# Patient Record
Sex: Male | Born: 1940 | Race: White | Hispanic: No | Marital: Married | Smoking: Former smoker
Health system: Southern US, Community
[De-identification: ages and names within clinical notes are randomized; demographics above are authoritative.]

## PROBLEM LIST (undated history)

## (undated) DIAGNOSIS — IMO0001 Reserved for inherently not codable concepts without codable children: Secondary | ICD-10-CM

## (undated) DIAGNOSIS — I350 Nonrheumatic aortic (valve) stenosis: Secondary | ICD-10-CM

## (undated) DIAGNOSIS — M1990 Unspecified osteoarthritis, unspecified site: Secondary | ICD-10-CM

## (undated) DIAGNOSIS — M109 Gout, unspecified: Secondary | ICD-10-CM

## (undated) DIAGNOSIS — I10 Essential (primary) hypertension: Secondary | ICD-10-CM

## (undated) DIAGNOSIS — I2510 Atherosclerotic heart disease of native coronary artery without angina pectoris: Secondary | ICD-10-CM

## (undated) DIAGNOSIS — E785 Hyperlipidemia, unspecified: Secondary | ICD-10-CM

## (undated) DIAGNOSIS — N400 Enlarged prostate without lower urinary tract symptoms: Secondary | ICD-10-CM

---

## 2006-04-15 ENCOUNTER — Ambulatory Visit: Payer: Self-pay | Admitting: Gastroenterology

## 2011-09-28 ENCOUNTER — Ambulatory Visit: Payer: Self-pay | Admitting: Gastroenterology

## 2014-02-19 DIAGNOSIS — I350 Nonrheumatic aortic (valve) stenosis: Secondary | ICD-10-CM | POA: Diagnosis not present

## 2014-02-19 DIAGNOSIS — I10 Essential (primary) hypertension: Secondary | ICD-10-CM | POA: Diagnosis not present

## 2014-02-19 DIAGNOSIS — E785 Hyperlipidemia, unspecified: Secondary | ICD-10-CM | POA: Diagnosis not present

## 2014-02-19 DIAGNOSIS — Z23 Encounter for immunization: Secondary | ICD-10-CM | POA: Diagnosis not present

## 2014-02-19 DIAGNOSIS — Z0000 Encounter for general adult medical examination without abnormal findings: Secondary | ICD-10-CM | POA: Diagnosis not present

## 2014-02-19 DIAGNOSIS — M109 Gout, unspecified: Secondary | ICD-10-CM | POA: Diagnosis not present

## 2014-02-26 DIAGNOSIS — I350 Nonrheumatic aortic (valve) stenosis: Secondary | ICD-10-CM | POA: Diagnosis not present

## 2014-02-26 DIAGNOSIS — I10 Essential (primary) hypertension: Secondary | ICD-10-CM | POA: Diagnosis not present

## 2014-02-26 DIAGNOSIS — R0602 Shortness of breath: Secondary | ICD-10-CM | POA: Diagnosis not present

## 2014-02-26 DIAGNOSIS — E782 Mixed hyperlipidemia: Secondary | ICD-10-CM | POA: Diagnosis not present

## 2014-03-05 DIAGNOSIS — I350 Nonrheumatic aortic (valve) stenosis: Secondary | ICD-10-CM | POA: Diagnosis not present

## 2014-03-05 DIAGNOSIS — E782 Mixed hyperlipidemia: Secondary | ICD-10-CM | POA: Diagnosis not present

## 2014-03-05 DIAGNOSIS — I10 Essential (primary) hypertension: Secondary | ICD-10-CM | POA: Diagnosis not present

## 2014-03-05 DIAGNOSIS — R0602 Shortness of breath: Secondary | ICD-10-CM | POA: Diagnosis not present

## 2014-05-08 DIAGNOSIS — E782 Mixed hyperlipidemia: Secondary | ICD-10-CM | POA: Diagnosis not present

## 2014-05-08 DIAGNOSIS — R0602 Shortness of breath: Secondary | ICD-10-CM | POA: Diagnosis not present

## 2014-05-08 DIAGNOSIS — I350 Nonrheumatic aortic (valve) stenosis: Secondary | ICD-10-CM | POA: Diagnosis not present

## 2014-05-08 DIAGNOSIS — I10 Essential (primary) hypertension: Secondary | ICD-10-CM | POA: Diagnosis not present

## 2014-05-22 DIAGNOSIS — D3131 Benign neoplasm of right choroid: Secondary | ICD-10-CM | POA: Diagnosis not present

## 2014-11-16 DIAGNOSIS — I10 Essential (primary) hypertension: Secondary | ICD-10-CM | POA: Diagnosis not present

## 2014-11-16 DIAGNOSIS — I350 Nonrheumatic aortic (valve) stenosis: Secondary | ICD-10-CM | POA: Diagnosis not present

## 2014-11-16 DIAGNOSIS — E782 Mixed hyperlipidemia: Secondary | ICD-10-CM | POA: Diagnosis not present

## 2014-11-30 DIAGNOSIS — I350 Nonrheumatic aortic (valve) stenosis: Secondary | ICD-10-CM | POA: Diagnosis not present

## 2014-12-03 DIAGNOSIS — I350 Nonrheumatic aortic (valve) stenosis: Secondary | ICD-10-CM | POA: Diagnosis not present

## 2014-12-03 DIAGNOSIS — I10 Essential (primary) hypertension: Secondary | ICD-10-CM | POA: Diagnosis not present

## 2015-02-01 DIAGNOSIS — I350 Nonrheumatic aortic (valve) stenosis: Secondary | ICD-10-CM | POA: Diagnosis not present

## 2015-02-01 DIAGNOSIS — I10 Essential (primary) hypertension: Secondary | ICD-10-CM | POA: Diagnosis not present

## 2015-02-01 DIAGNOSIS — E782 Mixed hyperlipidemia: Secondary | ICD-10-CM | POA: Diagnosis not present

## 2015-02-15 DIAGNOSIS — Z8739 Personal history of other diseases of the musculoskeletal system and connective tissue: Secondary | ICD-10-CM | POA: Diagnosis not present

## 2015-02-15 DIAGNOSIS — Z0000 Encounter for general adult medical examination without abnormal findings: Secondary | ICD-10-CM | POA: Diagnosis not present

## 2015-02-15 DIAGNOSIS — I10 Essential (primary) hypertension: Secondary | ICD-10-CM | POA: Diagnosis not present

## 2015-02-15 DIAGNOSIS — E782 Mixed hyperlipidemia: Secondary | ICD-10-CM | POA: Diagnosis not present

## 2015-02-22 DIAGNOSIS — I350 Nonrheumatic aortic (valve) stenosis: Secondary | ICD-10-CM | POA: Diagnosis not present

## 2015-02-22 DIAGNOSIS — R0602 Shortness of breath: Secondary | ICD-10-CM | POA: Diagnosis not present

## 2015-02-22 DIAGNOSIS — E782 Mixed hyperlipidemia: Secondary | ICD-10-CM | POA: Diagnosis not present

## 2015-02-22 DIAGNOSIS — I10 Essential (primary) hypertension: Secondary | ICD-10-CM | POA: Diagnosis not present

## 2015-02-27 ENCOUNTER — Encounter
Admission: RE | Disposition: A | Discharge status: Home / Self Care | Payer: Self-pay | Source: Ambulatory Visit | Attending: Internal Medicine

## 2015-02-27 ENCOUNTER — Ambulatory Visit
Admission: RE | Admit: 2015-02-27 | Discharge: 2015-02-27 | Disposition: A | Discharge status: Home / Self Care | Payer: Medicare Other | Source: Ambulatory Visit | Attending: Internal Medicine | Admitting: Internal Medicine

## 2015-02-27 ENCOUNTER — Encounter: Payer: Self-pay | Admitting: *Deleted

## 2015-02-27 DIAGNOSIS — E782 Mixed hyperlipidemia: Secondary | ICD-10-CM | POA: Diagnosis not present

## 2015-02-27 DIAGNOSIS — E790 Hyperuricemia without signs of inflammatory arthritis and tophaceous disease: Secondary | ICD-10-CM | POA: Insufficient documentation

## 2015-02-27 DIAGNOSIS — N400 Enlarged prostate without lower urinary tract symptoms: Secondary | ICD-10-CM | POA: Insufficient documentation

## 2015-02-27 DIAGNOSIS — Z79899 Other long term (current) drug therapy: Secondary | ICD-10-CM | POA: Diagnosis not present

## 2015-02-27 DIAGNOSIS — Z823 Family history of stroke: Secondary | ICD-10-CM | POA: Diagnosis not present

## 2015-02-27 DIAGNOSIS — I712 Thoracic aortic aneurysm, without rupture: Secondary | ICD-10-CM | POA: Insufficient documentation

## 2015-02-27 DIAGNOSIS — I719 Aortic aneurysm of unspecified site, without rupture: Secondary | ICD-10-CM | POA: Diagnosis not present

## 2015-02-27 DIAGNOSIS — I350 Nonrheumatic aortic (valve) stenosis: Secondary | ICD-10-CM | POA: Diagnosis not present

## 2015-02-27 DIAGNOSIS — Z809 Family history of malignant neoplasm, unspecified: Secondary | ICD-10-CM | POA: Diagnosis not present

## 2015-02-27 DIAGNOSIS — I10 Essential (primary) hypertension: Secondary | ICD-10-CM | POA: Insufficient documentation

## 2015-02-27 HISTORY — DX: Hyperlipidemia, unspecified: E78.5

## 2015-02-27 HISTORY — DX: Gout, unspecified: M10.9

## 2015-02-27 HISTORY — DX: Atherosclerotic heart disease of native coronary artery without angina pectoris: I25.10

## 2015-02-27 HISTORY — PX: CARDIAC CATHETERIZATION: SHX172

## 2015-02-27 HISTORY — DX: Reserved for inherently not codable concepts without codable children: IMO0001

## 2015-02-27 HISTORY — DX: Benign prostatic hyperplasia without lower urinary tract symptoms: N40.0

## 2015-02-27 HISTORY — DX: Nonrheumatic aortic (valve) stenosis: I35.0

## 2015-02-27 HISTORY — DX: Essential (primary) hypertension: I10

## 2015-02-27 SURGERY — RIGHT/LEFT HEART CATH AND CORONARY ANGIOGRAPHY
Anesthesia: Moderate Sedation

## 2015-02-27 MED ORDER — FENTANYL CITRATE (PF) 100 MCG/2ML IJ SOLN
INTRAMUSCULAR | Status: AC
  Filled 2015-02-27: qty 2

## 2015-02-27 MED ORDER — MIDAZOLAM HCL 2 MG/2ML IJ SOLN
INTRAMUSCULAR | Status: DC | PRN
  Administered 2015-02-27 (×2): 1 mg via INTRAVENOUS

## 2015-02-27 MED ORDER — FENTANYL CITRATE (PF) 100 MCG/2ML IJ SOLN
INTRAMUSCULAR | Status: DC | PRN
  Administered 2015-02-27 (×2): 25 ug via INTRAVENOUS

## 2015-02-27 MED ORDER — SODIUM CHLORIDE 0.9% FLUSH: 3.0000 mL | INTRAVENOUS | Status: DC | PRN

## 2015-02-27 MED ORDER — SODIUM CHLORIDE 0.9 % IV SOLN
INTRAVENOUS | Status: DC
Start: 2015-02-27 — End: 2015-02-27
  Administered 2015-02-27: 07:00:00 via INTRAVENOUS

## 2015-02-27 MED ORDER — MIDAZOLAM HCL 2 MG/2ML IJ SOLN
INTRAMUSCULAR | Status: AC
  Filled 2015-02-27: qty 2

## 2015-02-27 MED ORDER — HEPARIN (PORCINE) IN NACL 2-0.9 UNIT/ML-% IJ SOLN
INTRAMUSCULAR | Status: AC
  Filled 2015-02-27: qty 500

## 2015-02-27 MED ORDER — IOHEXOL 300 MG/ML  SOLN
INTRAMUSCULAR | Status: DC | PRN
  Administered 2015-02-27: 100 mL via INTRA_ARTERIAL

## 2015-02-27 SURGICAL SUPPLY — 16 items
CATH INFINITI 5 FR MPA2 (CATHETERS) ×3 IMPLANT
CATH INFINITI 5FR ANG PIGTAIL (CATHETERS) ×3 IMPLANT
CATH INFINITI 5FR JL4 (CATHETERS) ×3 IMPLANT
CATH INFINITI JR4 5F (CATHETERS) ×3 IMPLANT
CATH SWANZ 7F THERMO (CATHETERS) ×3 IMPLANT
DEVICE CLOSURE MYNXGRIP 6/7F (Vascular Products) ×3 IMPLANT
GUIDEWIRE EMER 3M J .025X150CM (WIRE) ×3 IMPLANT
KIT MANI 3VAL PERCEP (MISCELLANEOUS) ×3 IMPLANT
KIT RIGHT HEART (MISCELLANEOUS) ×3 IMPLANT
NEEDLE PERC 18GX7CM (NEEDLE) ×3 IMPLANT
PACK CARDIAC CATH (CUSTOM PROCEDURE TRAY) ×3 IMPLANT
SHEATH PINNACLE 5F 10CM (SHEATH) IMPLANT
SHEATH PINNACLE 6F 10CM (SHEATH) ×3 IMPLANT
SHEATH PINNACLE 7F 10CM (SHEATH) ×3 IMPLANT
WIRE EMERALD 3MM-J .035X150CM (WIRE) ×3 IMPLANT
WIRE EMERALD 3MM-J .035X260CM (WIRE) ×3 IMPLANT

## 2015-02-27 NOTE — Discharge Instructions (Signed)
Groin Insertion Instructions-If you lose feeling or develop tingling or pain in your leg or foot after the procedure, please walk around first.  If the discomfort does not improve , contact your physician and proceed to the nearest emergency room.  Loss of feeling in your leg might mean that a blockage has formed in the artery and this can be appropriately treated.  Limit your activity for the next two days after your procedure.  Avoid stooping, bending, heavy lifting or exertion as this may put pressure on the insertion site.  Resume normal activities in 48 hours.  You may shower after 24 hours but avoid excessive warm water and do not scrub the site.  Remove clear dressing in 48 hours.  If you have had a closure device inserted, do not soak in a tub bath or a hot tub for at least one week.  No driving for 48 hours after discharge.  After the procedure, check the insertion site occasionally.  If any oozing occurs or there is apparent swelling, firm pressure over the site will prevent a bruise from forming.  You can not hurt anything by pressing directly on the site.  The pressure stops the bleeding by allowing a small clot to form.  If the bleeding continues after the pressure has been applied for more than 15 minutes, call 911 or go to the nearest emergency room.    The x-ray dye causes you to pass a considerate amount of urine.  For this reason, you will be asked to drink plenty of liquids after the procedure to prevent dehydration.  You may resume you regular diet.  Avoid caffeine products.    For pain at the site of your procedure, take non-aspirin medicines such as Tylenol.  Medications: A. Hold Metformin for 48 hours if applicable.  B. Continue taking all your present medications at home unless your doctor prescribes any changes.Groin Insertion Instructions-If you lose feeling or develop tingling or pain in your leg or foot after the procedure, please walk around first.  If the discomfort does not  improve , contact your physician and proceed to the nearest emergency room.  Loss of feeling in your leg might mean that a blockage has formed in the artery and this can be appropriately treated.  Limit your activity for the next two days after your procedure.  Avoid stooping, bending, heavy lifting or exertion as this may put pressure on the insertion site.  Resume normal activities in 48 hours.  You may shower after 24 hours but avoid excessive warm water and do not scrub the site.  Remove clear dressing in 48 hours.  If you have had a closure device inserted, do not soak in a tub bath or a hot tub for at least one week.

## 2015-02-28 ENCOUNTER — Encounter: Payer: Self-pay | Admitting: Internal Medicine

## 2015-03-11 DIAGNOSIS — I10 Essential (primary) hypertension: Secondary | ICD-10-CM | POA: Diagnosis not present

## 2015-03-11 DIAGNOSIS — I350 Nonrheumatic aortic (valve) stenosis: Secondary | ICD-10-CM | POA: Diagnosis not present

## 2015-03-11 DIAGNOSIS — Q251 Coarctation of aorta: Secondary | ICD-10-CM | POA: Diagnosis not present

## 2015-03-15 DIAGNOSIS — E782 Mixed hyperlipidemia: Secondary | ICD-10-CM | POA: Diagnosis not present

## 2015-03-15 DIAGNOSIS — I517 Cardiomegaly: Secondary | ICD-10-CM | POA: Diagnosis not present

## 2015-03-15 DIAGNOSIS — R0602 Shortness of breath: Secondary | ICD-10-CM | POA: Diagnosis not present

## 2015-03-15 DIAGNOSIS — I712 Thoracic aortic aneurysm, without rupture: Secondary | ICD-10-CM | POA: Diagnosis not present

## 2015-03-15 DIAGNOSIS — I350 Nonrheumatic aortic (valve) stenosis: Secondary | ICD-10-CM | POA: Diagnosis not present

## 2015-03-15 DIAGNOSIS — I10 Essential (primary) hypertension: Secondary | ICD-10-CM | POA: Diagnosis not present

## 2015-03-15 DIAGNOSIS — Q251 Coarctation of aorta: Secondary | ICD-10-CM | POA: Diagnosis not present

## 2015-03-15 DIAGNOSIS — R918 Other nonspecific abnormal finding of lung field: Secondary | ICD-10-CM | POA: Diagnosis not present

## 2015-03-15 DIAGNOSIS — I358 Other nonrheumatic aortic valve disorders: Secondary | ICD-10-CM | POA: Diagnosis not present

## 2015-03-15 DIAGNOSIS — Q231 Congenital insufficiency of aortic valve: Secondary | ICD-10-CM | POA: Diagnosis not present

## 2015-03-28 DIAGNOSIS — I712 Thoracic aortic aneurysm, without rupture: Secondary | ICD-10-CM | POA: Diagnosis not present

## 2015-03-28 DIAGNOSIS — Z01810 Encounter for preprocedural cardiovascular examination: Secondary | ICD-10-CM | POA: Diagnosis not present

## 2015-03-28 DIAGNOSIS — Q251 Coarctation of aorta: Secondary | ICD-10-CM | POA: Diagnosis not present

## 2015-03-28 DIAGNOSIS — I358 Other nonrheumatic aortic valve disorders: Secondary | ICD-10-CM | POA: Diagnosis not present

## 2015-03-28 DIAGNOSIS — I10 Essential (primary) hypertension: Secondary | ICD-10-CM | POA: Diagnosis not present

## 2015-03-28 DIAGNOSIS — I350 Nonrheumatic aortic (valve) stenosis: Secondary | ICD-10-CM | POA: Diagnosis not present

## 2015-03-28 DIAGNOSIS — I714 Abdominal aortic aneurysm, without rupture: Secondary | ICD-10-CM | POA: Diagnosis not present

## 2015-03-28 DIAGNOSIS — I2789 Other specified pulmonary heart diseases: Secondary | ICD-10-CM | POA: Diagnosis not present

## 2015-04-10 DIAGNOSIS — Q231 Congenital insufficiency of aortic valve: Secondary | ICD-10-CM | POA: Diagnosis not present

## 2015-04-10 DIAGNOSIS — E875 Hyperkalemia: Secondary | ICD-10-CM | POA: Diagnosis not present

## 2015-04-10 DIAGNOSIS — I700 Atherosclerosis of aorta: Secondary | ICD-10-CM | POA: Diagnosis not present

## 2015-04-10 DIAGNOSIS — D72829 Elevated white blood cell count, unspecified: Secondary | ICD-10-CM | POA: Diagnosis not present

## 2015-04-10 DIAGNOSIS — R1312 Dysphagia, oropharyngeal phase: Secondary | ICD-10-CM | POA: Diagnosis not present

## 2015-04-10 DIAGNOSIS — Q251 Coarctation of aorta: Secondary | ICD-10-CM | POA: Diagnosis not present

## 2015-04-10 DIAGNOSIS — I4891 Unspecified atrial fibrillation: Secondary | ICD-10-CM | POA: Diagnosis not present

## 2015-04-10 DIAGNOSIS — Z87891 Personal history of nicotine dependence: Secondary | ICD-10-CM | POA: Diagnosis not present

## 2015-04-10 DIAGNOSIS — Q2542 Hypoplasia of aorta: Secondary | ICD-10-CM | POA: Diagnosis not present

## 2015-04-10 DIAGNOSIS — Z823 Family history of stroke: Secondary | ICD-10-CM | POA: Diagnosis not present

## 2015-04-10 DIAGNOSIS — R739 Hyperglycemia, unspecified: Secondary | ICD-10-CM | POA: Diagnosis not present

## 2015-04-10 DIAGNOSIS — R918 Other nonspecific abnormal finding of lung field: Secondary | ICD-10-CM | POA: Diagnosis not present

## 2015-04-10 DIAGNOSIS — J9811 Atelectasis: Secondary | ICD-10-CM | POA: Diagnosis not present

## 2015-04-10 DIAGNOSIS — Z8679 Personal history of other diseases of the circulatory system: Secondary | ICD-10-CM | POA: Diagnosis not present

## 2015-04-10 DIAGNOSIS — I712 Thoracic aortic aneurysm, without rupture: Secondary | ICD-10-CM | POA: Diagnosis not present

## 2015-04-10 DIAGNOSIS — D62 Acute posthemorrhagic anemia: Secondary | ICD-10-CM | POA: Diagnosis not present

## 2015-04-10 DIAGNOSIS — I509 Heart failure, unspecified: Secondary | ICD-10-CM | POA: Diagnosis not present

## 2015-04-10 DIAGNOSIS — N400 Enlarged prostate without lower urinary tract symptoms: Secondary | ICD-10-CM | POA: Diagnosis present

## 2015-04-10 DIAGNOSIS — R1310 Dysphagia, unspecified: Secondary | ICD-10-CM | POA: Diagnosis not present

## 2015-04-10 DIAGNOSIS — Z4682 Encounter for fitting and adjustment of non-vascular catheter: Secondary | ICD-10-CM | POA: Diagnosis not present

## 2015-04-10 DIAGNOSIS — E782 Mixed hyperlipidemia: Secondary | ICD-10-CM | POA: Diagnosis present

## 2015-04-10 DIAGNOSIS — J939 Pneumothorax, unspecified: Secondary | ICD-10-CM | POA: Diagnosis not present

## 2015-04-10 DIAGNOSIS — Z8774 Personal history of (corrected) congenital malformations of heart and circulatory system: Secondary | ICD-10-CM | POA: Diagnosis not present

## 2015-04-10 DIAGNOSIS — I350 Nonrheumatic aortic (valve) stenosis: Secondary | ICD-10-CM | POA: Diagnosis not present

## 2015-04-10 DIAGNOSIS — I4510 Unspecified right bundle-branch block: Secondary | ICD-10-CM | POA: Diagnosis not present

## 2015-04-10 DIAGNOSIS — J90 Pleural effusion, not elsewhere classified: Secondary | ICD-10-CM | POA: Diagnosis not present

## 2015-04-10 DIAGNOSIS — N170 Acute kidney failure with tubular necrosis: Secondary | ICD-10-CM | POA: Diagnosis not present

## 2015-04-10 DIAGNOSIS — G8912 Acute post-thoracotomy pain: Secondary | ICD-10-CM | POA: Diagnosis not present

## 2015-04-10 DIAGNOSIS — I110 Hypertensive heart disease with heart failure: Secondary | ICD-10-CM | POA: Diagnosis not present

## 2015-04-10 DIAGNOSIS — I10 Essential (primary) hypertension: Secondary | ICD-10-CM | POA: Diagnosis present

## 2015-04-10 DIAGNOSIS — I517 Cardiomegaly: Secondary | ICD-10-CM | POA: Diagnosis not present

## 2015-04-10 DIAGNOSIS — I352 Nonrheumatic aortic (valve) stenosis with insufficiency: Secondary | ICD-10-CM | POA: Diagnosis not present

## 2015-04-10 DIAGNOSIS — R4702 Dysphasia: Secondary | ICD-10-CM | POA: Diagnosis not present

## 2015-04-10 DIAGNOSIS — Z942 Lung transplant status: Secondary | ICD-10-CM | POA: Diagnosis not present

## 2015-04-17 DIAGNOSIS — I10 Essential (primary) hypertension: Secondary | ICD-10-CM | POA: Diagnosis not present

## 2015-04-17 DIAGNOSIS — Z48812 Encounter for surgical aftercare following surgery on the circulatory system: Secondary | ICD-10-CM | POA: Diagnosis not present

## 2015-04-19 DIAGNOSIS — I10 Essential (primary) hypertension: Secondary | ICD-10-CM | POA: Diagnosis not present

## 2015-04-19 DIAGNOSIS — Z48812 Encounter for surgical aftercare following surgery on the circulatory system: Secondary | ICD-10-CM | POA: Diagnosis not present

## 2015-04-22 DIAGNOSIS — I10 Essential (primary) hypertension: Secondary | ICD-10-CM | POA: Diagnosis not present

## 2015-04-22 DIAGNOSIS — Z48812 Encounter for surgical aftercare following surgery on the circulatory system: Secondary | ICD-10-CM | POA: Diagnosis not present

## 2015-04-24 DIAGNOSIS — E782 Mixed hyperlipidemia: Secondary | ICD-10-CM | POA: Diagnosis not present

## 2015-04-24 DIAGNOSIS — I480 Paroxysmal atrial fibrillation: Secondary | ICD-10-CM | POA: Diagnosis not present

## 2015-04-24 DIAGNOSIS — I10 Essential (primary) hypertension: Secondary | ICD-10-CM | POA: Diagnosis not present

## 2015-04-24 DIAGNOSIS — Q251 Coarctation of aorta: Secondary | ICD-10-CM | POA: Diagnosis not present

## 2015-04-24 DIAGNOSIS — I4891 Unspecified atrial fibrillation: Secondary | ICD-10-CM | POA: Diagnosis not present

## 2015-04-24 DIAGNOSIS — Z48812 Encounter for surgical aftercare following surgery on the circulatory system: Secondary | ICD-10-CM | POA: Diagnosis not present

## 2015-04-26 DIAGNOSIS — R0602 Shortness of breath: Secondary | ICD-10-CM | POA: Diagnosis not present

## 2015-04-26 DIAGNOSIS — Z8774 Personal history of (corrected) congenital malformations of heart and circulatory system: Secondary | ICD-10-CM | POA: Diagnosis not present

## 2015-04-26 DIAGNOSIS — E782 Mixed hyperlipidemia: Secondary | ICD-10-CM | POA: Diagnosis not present

## 2015-04-26 DIAGNOSIS — I4891 Unspecified atrial fibrillation: Secondary | ICD-10-CM | POA: Diagnosis not present

## 2015-04-26 DIAGNOSIS — I480 Paroxysmal atrial fibrillation: Secondary | ICD-10-CM | POA: Diagnosis not present

## 2015-04-26 DIAGNOSIS — I10 Essential (primary) hypertension: Secondary | ICD-10-CM | POA: Diagnosis not present

## 2015-04-26 DIAGNOSIS — Z953 Presence of xenogenic heart valve: Secondary | ICD-10-CM | POA: Diagnosis not present

## 2015-04-30 DIAGNOSIS — J90 Pleural effusion, not elsewhere classified: Secondary | ICD-10-CM | POA: Diagnosis not present

## 2015-04-30 DIAGNOSIS — J9811 Atelectasis: Secondary | ICD-10-CM | POA: Diagnosis not present

## 2015-04-30 DIAGNOSIS — Z8774 Personal history of (corrected) congenital malformations of heart and circulatory system: Secondary | ICD-10-CM | POA: Diagnosis not present

## 2015-04-30 DIAGNOSIS — Z8679 Personal history of other diseases of the circulatory system: Secondary | ICD-10-CM | POA: Diagnosis not present

## 2015-04-30 DIAGNOSIS — Z4802 Encounter for removal of sutures: Secondary | ICD-10-CM | POA: Diagnosis not present

## 2015-04-30 DIAGNOSIS — Z48812 Encounter for surgical aftercare following surgery on the circulatory system: Secondary | ICD-10-CM | POA: Diagnosis not present

## 2015-04-30 DIAGNOSIS — Z953 Presence of xenogenic heart valve: Secondary | ICD-10-CM | POA: Diagnosis not present

## 2015-04-30 DIAGNOSIS — I452 Bifascicular block: Secondary | ICD-10-CM | POA: Diagnosis not present

## 2015-04-30 DIAGNOSIS — Z9889 Other specified postprocedural states: Secondary | ICD-10-CM | POA: Diagnosis not present

## 2015-04-30 DIAGNOSIS — E875 Hyperkalemia: Secondary | ICD-10-CM | POA: Diagnosis not present

## 2015-05-01 DIAGNOSIS — I10 Essential (primary) hypertension: Secondary | ICD-10-CM | POA: Diagnosis not present

## 2015-05-01 DIAGNOSIS — Z48812 Encounter for surgical aftercare following surgery on the circulatory system: Secondary | ICD-10-CM | POA: Diagnosis not present

## 2015-05-07 DIAGNOSIS — I480 Paroxysmal atrial fibrillation: Secondary | ICD-10-CM | POA: Diagnosis not present

## 2015-05-07 DIAGNOSIS — I10 Essential (primary) hypertension: Secondary | ICD-10-CM | POA: Diagnosis not present

## 2015-05-07 DIAGNOSIS — Q251 Coarctation of aorta: Secondary | ICD-10-CM | POA: Diagnosis not present

## 2015-05-07 DIAGNOSIS — Z953 Presence of xenogenic heart valve: Secondary | ICD-10-CM | POA: Diagnosis not present

## 2015-05-08 ENCOUNTER — Ambulatory Visit
Admission: EM | Admit: 2015-05-08 | Discharge: 2015-05-08 | Disposition: A | Discharge status: Home / Self Care | Payer: Medicare Other | Attending: Family Medicine | Admitting: Family Medicine

## 2015-05-08 ENCOUNTER — Encounter: Payer: Self-pay | Admitting: Emergency Medicine

## 2015-05-08 ENCOUNTER — Emergency Department
Admission: EM | Admit: 2015-05-08 | Discharge: 2015-05-08 | Disposition: A | Discharge status: Home / Self Care | Payer: Medicare Other | Attending: Emergency Medicine | Admitting: Emergency Medicine

## 2015-05-08 ENCOUNTER — Encounter: Payer: Self-pay | Admitting: Medical Oncology

## 2015-05-08 DIAGNOSIS — Z87891 Personal history of nicotine dependence: Secondary | ICD-10-CM | POA: Diagnosis not present

## 2015-05-08 DIAGNOSIS — R310 Gross hematuria: Secondary | ICD-10-CM

## 2015-05-08 DIAGNOSIS — I2510 Atherosclerotic heart disease of native coronary artery without angina pectoris: Secondary | ICD-10-CM | POA: Insufficient documentation

## 2015-05-08 DIAGNOSIS — Z7982 Long term (current) use of aspirin: Secondary | ICD-10-CM | POA: Diagnosis not present

## 2015-05-08 DIAGNOSIS — M2540 Effusion, unspecified joint: Secondary | ICD-10-CM | POA: Diagnosis not present

## 2015-05-08 DIAGNOSIS — S5001XA Contusion of right elbow, initial encounter: Secondary | ICD-10-CM | POA: Insufficient documentation

## 2015-05-08 DIAGNOSIS — Z79899 Other long term (current) drug therapy: Secondary | ICD-10-CM | POA: Diagnosis not present

## 2015-05-08 DIAGNOSIS — R0602 Shortness of breath: Secondary | ICD-10-CM | POA: Diagnosis not present

## 2015-05-08 DIAGNOSIS — Z9889 Other specified postprocedural states: Secondary | ICD-10-CM | POA: Diagnosis not present

## 2015-05-08 DIAGNOSIS — R791 Abnormal coagulation profile: Secondary | ICD-10-CM

## 2015-05-08 DIAGNOSIS — I4891 Unspecified atrial fibrillation: Secondary | ICD-10-CM | POA: Insufficient documentation

## 2015-05-08 DIAGNOSIS — E785 Hyperlipidemia, unspecified: Secondary | ICD-10-CM | POA: Diagnosis not present

## 2015-05-08 DIAGNOSIS — R319 Hematuria, unspecified: Secondary | ICD-10-CM | POA: Diagnosis present

## 2015-05-08 DIAGNOSIS — R040 Epistaxis: Secondary | ICD-10-CM | POA: Diagnosis not present

## 2015-05-08 DIAGNOSIS — X58XXXA Exposure to other specified factors, initial encounter: Secondary | ICD-10-CM | POA: Diagnosis not present

## 2015-05-08 DIAGNOSIS — Y939 Activity, unspecified: Secondary | ICD-10-CM | POA: Insufficient documentation

## 2015-05-08 DIAGNOSIS — I10 Essential (primary) hypertension: Secondary | ICD-10-CM | POA: Insufficient documentation

## 2015-05-08 DIAGNOSIS — M109 Gout, unspecified: Secondary | ICD-10-CM | POA: Insufficient documentation

## 2015-05-08 DIAGNOSIS — Y999 Unspecified external cause status: Secondary | ICD-10-CM | POA: Insufficient documentation

## 2015-05-08 DIAGNOSIS — Z7901 Long term (current) use of anticoagulants: Secondary | ICD-10-CM | POA: Diagnosis not present

## 2015-05-08 DIAGNOSIS — Y929 Unspecified place or not applicable: Secondary | ICD-10-CM | POA: Insufficient documentation

## 2015-05-08 DIAGNOSIS — IMO0002 Reserved for concepts with insufficient information to code with codable children: Secondary | ICD-10-CM

## 2015-05-08 DIAGNOSIS — N400 Enlarged prostate without lower urinary tract symptoms: Secondary | ICD-10-CM | POA: Diagnosis not present

## 2015-05-08 DIAGNOSIS — Z48812 Encounter for surgical aftercare following surgery on the circulatory system: Secondary | ICD-10-CM | POA: Diagnosis not present

## 2015-05-08 DIAGNOSIS — T148XXA Other injury of unspecified body region, initial encounter: Secondary | ICD-10-CM

## 2015-05-08 DIAGNOSIS — M25529 Pain in unspecified elbow: Secondary | ICD-10-CM | POA: Insufficient documentation

## 2015-05-08 LAB — COMPREHENSIVE METABOLIC PANEL
ALBUMIN: 3.7 g/dL (ref 3.5–5.0)
ALT: 26 U/L (ref 17–63)
ANION GAP: 9 (ref 5–15)
AST: 32 U/L (ref 15–41)
Alkaline Phosphatase: 94 U/L (ref 38–126)
BILIRUBIN TOTAL: 0.8 mg/dL (ref 0.3–1.2)
BUN: 16 mg/dL (ref 6–20)
CHLORIDE: 102 mmol/L (ref 101–111)
CO2: 25 mmol/L (ref 22–32)
Calcium: 9 mg/dL (ref 8.9–10.3)
Creatinine, Ser: 1.24 mg/dL (ref 0.61–1.24)
GFR calc Af Amer: >60 mL/min (ref >60)
GFR calc non Af Amer: 56 mL/min — ABNORMAL LOW (ref >60)
GLUCOSE: 108 mg/dL — AB (ref 65–99)
POTASSIUM: 4.3 mmol/L (ref 3.5–5.1)
SODIUM: 136 mmol/L (ref 135–145)
TOTAL PROTEIN: 7.6 g/dL (ref 6.5–8.1)

## 2015-05-08 LAB — CBC
HCT: 36.9 % — ABNORMAL LOW (ref 40.0–52.0)
HEMOGLOBIN: 12.1 g/dL — AB (ref 13.0–18.0)
MCH: 27.8 pg (ref 26.0–34.0)
MCHC: 32.7 g/dL (ref 32.0–36.0)
MCV: 85 fL (ref 80.0–100.0)
Platelets: 372 10*3/uL (ref 150–440)
RBC: 4.35 MIL/uL — ABNORMAL LOW (ref 4.40–5.90)
RDW: 15.5 % — ABNORMAL HIGH (ref 11.5–14.5)
WBC: 10.9 10*3/uL — ABNORMAL HIGH (ref 3.8–10.6)

## 2015-05-08 LAB — URINALYSIS COMPLETE WITH MICROSCOPIC (ARMC ONLY)
BACTERIA UA: NONE SEEN
SPECIFIC GRAVITY, URINE: 1.025 (ref 1.005–1.030)
SQUAMOUS EPITHELIAL / LPF: NONE SEEN
SQUAMOUS EPITHELIAL / LPF: NONE SEEN
Specific Gravity, Urine: 0 — ABNORMAL LOW (ref 1.005–1.030)
WBC UA: NONE SEEN WBC/hpf (ref 0–5)
pH: 0 — ABNORMAL LOW (ref 5.0–8.0)

## 2015-05-08 LAB — PROTIME-INR
INR: 13.56 — AB
INR: 11.32
PROTHROMBIN TIME: 95.2 s — AB (ref 11.4–15.0)
PROTHROMBIN TIME: 83.1 s — AB (ref 11.4–15.0)

## 2015-05-08 MED ORDER — VITAMIN K1 10 MG/ML IJ SOLN
10.0000 mg | Freq: Once | INTRAVENOUS | Status: AC
  Administered 2015-05-08: 10 mg via INTRAVENOUS
  Filled 2015-05-08: qty 1

## 2015-05-08 NOTE — Discharge Instructions (Signed)
INR=13.56  Due to nose bleeds, gross hematuria and elevation of INR recommend patient go to ED for further evaluation and management

## 2015-05-08 NOTE — ED Notes (Addendum)
Patient c/o swelling, tenderness, and bruising in his right elbow that started this morning.  Patient states he is on Warfarin. Patient denies injury.  Patient reports having blood in his urine this morning.

## 2015-05-08 NOTE — Discharge Instructions (Signed)
Return to the emergency department for new or worsening bleeding, feeling lightheaded or like you're going to pass out, increased swelling of your elbow, if you are unable to urinate, or for any other concerns. Talked to your doctor tomorrow about when they would like to recheck your INR. Do not take Coumadin until they advise you to.   Warfarin Coagulopathy Warfarin (Coumadin) coagulopathy refers to bleeding that may occur as a complication of the medicine warfarin. Warfarin is an oral blood thinner (anticoagulant). Warfarin is used for medical conditions where thinning of the blood is needed to prevent blood clots.  CAUSES Bleeding is the most common and most serious complication of warfarin. The amount of bleeding is related to the warfarin dose and length of treatment. In addition, bleeding complications can also occur due to:  Intentional or accidental warfarin overdose.  Underlying medical conditions.  Dietary changes.  Medicine, herbal, supplement, or alcohol interactions. SYMPTOMS Severe bleeding while on warfarin may occur from any tissue or organ. Symptoms of the blood being too thin may include:  Bleeding from the nose or gums.  Blood in bowel movements which may appear as bright red, dark, or black tarry stools.  Blood in the urine which may appear as pink, red, or brown urine.  Unusual bruising or bruising easily.  A cut that does not stop bleeding within 10 minutes.  Vomiting blood or continuous nausea for more than 1 day.  Coughing up blood.  Broken blood vessels in your eye (subconjunctival hemorrhage).  Abdominal or back pain with or without flank bruising.  Sudden, severe headache.  Sudden weakness or numbness of the face, arm, or leg, especially on one side of the body.  Sudden confusion.  Trouble speaking (aphasia) or understanding.  Sudden trouble seeing in one or both eyes.  Sudden trouble walking.  Dizziness.  Loss of balance or  coordination.  Vaginal bleeding.  Swelling or pain at an injection site.  Superficial fat tissue death (necrosis) which may cause skin scarring. This is more common in women and may first present as pain in the waist, thighs, or buttocks. HOME CARE INSTRUCTIONS  Always contact your health care provider of any concerns or signs of possible warfarin coagulopathy as soon as possible.  Take warfarin exactly as directed by your health care provider. It is recommended that you take your warfarin dose at the same time of the day. If you have been told to stop taking warfarin, do not resume taking warfarin until directed to do so by your health care provider. Follow your health care provider's instructions if you accidentally take an extra dose or miss a dose of warfarin. It is very important to take warfarin as directed since bleeding or blood clots could result in chronic or permanent injury, pain, or disability.  Keep all follow-up appointments with your health care provider as directed. It is very important to keep your appointments. Not keeping appointments could result in a chronic or permanent injury, pain, or disability because warfarin is a medicine that requires close monitoring.  While taking warfarin, you will need to have regular blood tests to measure your blood clotting time. These blood tests usually include both the prothrombin time (PT) and International Normalized Ratio (INR) tests. The PT and INR results allow your health care provider to adjust your dose of warfarin. The dose can change for many reasons. It is critically important that you have your PT and INR levels drawn exactly as directed. Your warfarin dose may stay the  same or change depending on what the PT and INR results are. Be sure to follow up with your health care provider regarding your PT and INR test results and what your warfarin dosage should be.  Many medicines can interfere with warfarin and affect the PT and INR  results. You must tell your health care provider about any and all medicines you take. This includes all vitamins and supplements. Ask your health care provider before taking these. Prescription and over-the-counter medicine consistency is critical to warfarin management. It is important that potential interactions are checked before you start a new medicine. Be especially cautious with aspirin and anti-inflammatory medicines. Ask your health care provider before taking these. Medicines such as antibiotics and acid-reducing medicine can interact with warfarin and can cause an increased warfarin effect. Warfarin can also interfere with the effectiveness of medicines you are taking. Do not take or discontinue any prescribed or over-the-counter medicine except on the advice of your health care provider or pharmacist.  Some vitamins, supplements, and herbal products interfere with the effectiveness of warfarin. Vitamin E may increase the anticoagulant effects of warfarin. Vitamin K can cause warfarin to be less effective. Do not take or discontinue any vitamin, supplement, or herbal product except on the advice of your health care provider or pharmacist.  Eat what you normally eat and keep the vitamin K content of your diet consistent. Avoid major changes in your diet, or notify your health care provider before changing your diet. Suddenly getting a lot more vitamin K could cause your blood to clot too quickly. A sudden decrease in vitamin K intake could cause your blood to clot too slowly. These changes in vitamin K intake could lead to dangerous blood clotsor to bleeding. To keep your vitamin K intake consistent, you must be aware of which foods contain moderate or high amounts of vitamin K. Some foods that are high in vitamin K include spinach, kale, broccoli, cabbage, greens, Brussels sprouts, asparagus, bok choy, coleslaw, and parsley. If you drink green tea, drink the same amount each day. Arrange a visit  with a dietitian to answer your questions.  If you have a loss of appetite or get the stomach flu (viral gastroenteritis), talk to your health care provider as soon as possible. A decrease in your normal vitamin K intake can make you more sensitive to your usual dose of warfarin.  Some medical conditions may increase your risk for bleeding while you are taking warfarin. A fever, diarrhea lasting more than a day, worsening heart failure, or worsening liver function are some medical conditions that could affect warfarin. Contact your health care provider if you have any of these medical conditions.  Be careful not to cut yourself when using sharp objects or while shaving.  Alcohol can change the body's ability to handle warfarin. It is best to avoid alcoholic drinks or consume only very small amounts while taking warfarin. Notify your health care provider if you change your alcohol intake. A sudden increase in alcohol use can increase your risk of bleeding. Chronic alcohol use can cause warfarin to be less effective.  Limit physical activities or sports that could result in a fall or cause injury.  Do not use warfarin if you are pregnant.  Inform all your health care providers and your dentist that you take warfarin.  Inform all health care providers if you are taking warfarin and aspirin or platelet inhibitor medicines such as clopidogrel, ticagrelor, or prasugrel. Use of these medicines in addition  to warfarin can increase your risk of bleeding or death. Taking these medicines together should only be done under the direct care of your health care providers. SEEK IMMEDIATE MEDICAL CARE IF:  You cough up blood.  You have dark or black stools or there is bright red blood coming from your rectum.  You vomit blood or have nausea for more than 1 day.  You have blood in the urine or pink-colored urine.  You have unusual bruising or have increased bruising.  You have bleeding from the nose or  gums that does not stop quickly.  You have a cut that does not stop bleeding within 2-3 minutes.  You have sudden weakness or numbness of the face, arm, or leg, especially on one side of the body.  You have sudden confusion.  You have trouble speaking (aphasia) or understanding.  You have sudden trouble seeing in one or both eyes.  You have sudden trouble walking.  You have dizziness.  You have a loss of balance or coordination.  You have a sudden, severe headache.  You have a serious fall or head injury, even if you are not bleeding.  You have swelling or pain at an injection site.  You have unexplained tenderness or pain in the abdomen, back, waist, thighs, or buttocks. Any of these symptoms may represent a serious problem that is an emergency. Do not wait to see if the symptoms will go away. Get medical help right away. Call your local emergency services (911 in [place] Do not drive yourself to the hospital.   This information is not intended to replace advice given to you by your health care provider. Make sure you discuss any questions you have with your health care provider.   Document Released: 12/28/2005 Document Revised: 06/05/2014 Document Reviewed: 06/30/2011 Elsevier Interactive Patient Education [employer].

## 2015-05-08 NOTE — ED Notes (Signed)
Patient c/o swelling, tenderness, and bruising in his right elbow that started this morning without injury. Pt was sent from [facility] urgent care. Patient states he is on Warfarin and his INR was 13.5. Pt has also been having hematuria and nose bleeds which is now resolved.

## 2015-05-08 NOTE — ED Provider Notes (Signed)
[facility] Emergency Department Provider Note ____________________________________________  Time seen: Approximately 6:21 PM  I have reviewed the triage vital signs and the nursing notes.   HISTORY  Chief Complaint Hematuria and Abnormal Lab  HPI Luke Gonzales is a 75 y.o. male a history of aortic stenosis status post bovine valve repair March 9 newly on Coumadin for A. fib who presents to the ER due to hematuria onset this morning, epistaxis that lasted 5 minutes from bilateral nares this morning, and swelling of right elbow that has increased from a small bump to approximately golf ball sized with bluish discoloration over the course of the day that is not tender.  His INR was 13; he states it was last checked when he was seen at [facility] for follow-up 2 weeks ago and he was not told that he needed any medication adjustments at that time. He states he has not been on any other new medications that could've interacted with it. His current dose is 5 mg a day. He otherwise feels well and chest soreness has been improving since the surgery.  Past Medical History  Diagnosis Date  . Hypertension   . Shortness of breath dyspnea   . Coronary artery disease   . Aortic stenosis s/p bicupspid valve  . BPH (benign prostatic hypertrophy)   . Gout   . Hyperlipemia     Patient Active Problem List   Diagnosis Date Noted  . Aortic stenosis, severe 02/27/2015    Past Surgical History  Procedure Laterality Date  . Cardiac catheterization N/A 02/27/2015    Procedure: Right/Left Heart Cath and Coronary Angiography;  Surgeon: Corey Skains, MD;  Location: [facility];  Service: Cardiovascular;  Laterality: N/A;    Current Outpatient Rx  Name  Route  Sig  Dispense  Refill  . allopurinol (ZYLOPRIM) 300 MG tablet   Oral   Take 300 mg by mouth daily.         Marland Kitchen amiodarone (PACERONE) 200 MG tablet   Oral   Take 200 mg by mouth 2 (two) times daily.          Marland Kitchen  aspirin 81 MG chewable tablet   Oral   Chew 81 mg by mouth at bedtime.          Marland Kitchen atorvastatin (LIPITOR) 40 MG tablet   Oral   Take 40 mg by mouth every evening.          . metoprolol (LOPRESSOR) 50 MG tablet   Oral   Take 50 mg by mouth 2 (two) times daily.         . niacin (NIASPAN) 1000 MG CR tablet   Oral   Take 1,000 mg by mouth at bedtime.         Marland Kitchen oxyCODONE (OXY IR/ROXICODONE) 5 MG immediate release tablet   Oral   Take 5 mg by mouth every 6 (six) hours as needed for severe pain.         Marland Kitchen warfarin (COUMADIN) 5 MG tablet   Oral   Take 5 mg by mouth every evening.            Allergies Review of patient's allergies indicates no known allergies.  No family history on file.  Social History Social History  Substance Use Topics  . Smoking status: Former Smoker -- 15 years    Types: Cigarettes  . Smokeless tobacco: None  . Alcohol Use: No    Review of Systems Constitutional: No fever  Cardiovascular: Denies new chest pain. Respiratory: Denies shortness of breath. Gastrointestinal: No abdominal pain.   Musculoskeletal: Negative for back pain.  10-point ROS otherwise negative.  ____________________________________________   PHYSICAL EXAM:  VITAL SIGNS: ED Triage Vitals  Enc Vitals Group     BP 05/08/15 1736 130/75 mmHg     Pulse Rate 05/08/15 1736 80     Resp 05/08/15 1736 18     Temp 05/08/15 1736 98.3 F (36.8 C)     Temp Source 05/08/15 1736 Oral     SpO2 05/08/15 1736 96 %     Weight 05/08/15 1736 178 lb (80.74 kg)     Height 05/08/15 1736 5\' 9"  (1.753 m)     Head Cir --      Peak Flow --      Pain Score 05/08/15 1736 3     Pain Loc --      Pain Edu? --      Excl. in [place]? --    Constitutional: Alert and oriented. Well appearing and in no acute distress. Eyes: Conjunctivae are normal. PERRL. EOMI. Head: Atraumatic. Nose: No congestion/rhinnorhea. Small amount of clotted blood right anterior nares Mouth/Throat: Mucous membranes  are moist.  Oropharynx non-erythematous. Neck: No stridor.   Cardiovascular: Normal rate, regular rhythm. Grossly normal heart sounds.  Good peripheral circulation. Respiratory: Normal respiratory effort.  No retractions. Lungs CTAB. Gastrointestinal: Soft and nontender. No distention. No abdominal bruits. No CVA tenderness. Musculoskeletal: No lower extremity tenderness nor edema.  R elbow with golf ball sized hematoma with ecchymosis; firm, non-tender Neurologic:  Normal speech and language. No gross focal neurologic deficits are appreciated. No gait instability. Skin:  Skin is warm, dry and intact. No rash noted. Psychiatric: Mood and affect are normal. Speech and behavior are normal.  ____________________________________________   LABS (all labs ordered are listed, but only abnormal results are displayed)  Labs Reviewed  COMPREHENSIVE METABOLIC PANEL - Abnormal; Notable for the following:    Glucose, Bld 108 (*)    GFR calc non Af Amer 56 (*)    All other components within normal limits  CBC - Abnormal; Notable for the following:    WBC 10.9 (*)    RBC 4.35 (*)    Hemoglobin 12.1 (*)    HCT 36.9 (*)    RDW 15.5 (*)    All other components within normal limits  PROTIME-INR - Abnormal; Notable for the following:    Prothrombin Time 83.1 (*)    INR 11.32 (*)    All other components within normal limits  URINALYSIS COMPLETEWITH MICROSCOPIC ([facility] ONLY) - Abnormal; Notable for the following:    Color, Urine RED (*)    APPearance TURBID (*)    Glucose, UA   (*)    Value: TEST NOT REPORTED DUE TO COLOR INTERFERENCE OF URINE PIGMENT   Bilirubin Urine   (*)    Value: TEST NOT REPORTED DUE TO COLOR INTERFERENCE OF URINE PIGMENT   Ketones, ur   (*)    Value: TEST NOT REPORTED DUE TO COLOR INTERFERENCE OF URINE PIGMENT   Hgb urine dipstick   (*)    Value: TEST NOT REPORTED DUE TO COLOR INTERFERENCE OF URINE PIGMENT   Protein, ur   (*)    Value: TEST NOT REPORTED DUE TO COLOR  INTERFERENCE OF URINE PIGMENT   Nitrite   (*)    Value: TEST NOT REPORTED DUE TO COLOR INTERFERENCE OF URINE PIGMENT   Leukocytes, UA   (*)  Value: TEST NOT REPORTED DUE TO COLOR INTERFERENCE OF URINE PIGMENT   All other components within normal limits   ____________________________________________   INITIAL IMPRESSION / ASSESSMENT AND PLAN / ED COURSE  Pertinent labs & imaging results that were available during my care of the patient were reviewed by me and considered in my medical decision making (see chart for details).  Would give patient IV vitamin K given hematuria, hematoma over right elbow, and epistaxis (currently controlled)  ----------------------------------------- 8:40 PM on 05/08/2015 ----------------------------------------- If further expansion of elbow hematoma, patient feeling comfortable with stable vital signs. H&H are stable. Patient has been given IV vitamin K and understands need to follow up for an INR recheck tomorrow or the following day. Strict return precautions for bleeding given.  Filed Vitals:   05/08/15 1945 05/08/15 2000  BP:  138/72  Pulse: 70 70  Temp:    Resp:       ____________________________________________   FINAL CLINICAL IMPRESSION(S) / ED DIAGNOSES  Final diagnoses:  Gross hematuria  Supratherapeutic INR  Epistaxis  Hematoma      New prescriptions started this visit New Prescriptions   No medications on file     Ponciano Ort, MD 05/08/15 2041

## 2015-05-08 NOTE — ED Provider Notes (Signed)
CSN: [id number]     Arrival date & time 05/08/15  1435 History   First MD Initiated Contact with Patient 05/08/15 1506     Chief Complaint  Patient presents with  . Elbow Pain  . Joint Swelling   (Consider location/radiation/quality/duration/timing/severity/associated sxs/prior Treatment) HPI Comments: 75 yo male presents with a c/o right elbow "knot" and bruising of the skin since this morning. Denies any injuries/trauma. States this morning had a spontaneous nosebleed that eventually subsided, but has been urinating blood. States has been taking warfarin for atrial fibrillation and notified his cardiologist this morning and patient was instructed to stop warfarin.   The history is provided by the patient.    Past Medical History  Diagnosis Date  . Hypertension   . Shortness of breath dyspnea   . Coronary artery disease   . Aortic stenosis s/p bicupspid valve  . BPH (benign prostatic hypertrophy)   . Gout   . Hyperlipemia    Past Surgical History  Procedure Laterality Date  . Cardiac catheterization N/A 02/27/2015    Procedure: Right/Left Heart Cath and Coronary Angiography;  Surgeon: Corey Skains, MD;  Location: [facility];  Service: Cardiovascular;  Laterality: N/A;   History reviewed. No pertinent family history. Social History  Substance Use Topics  . Smoking status: Former Smoker -- 15 years    Types: Cigarettes  . Smokeless tobacco: None  . Alcohol Use: No    Review of Systems  Allergies  Review of patient's allergies indicates no known allergies.  Home Medications   Prior to Admission medications   Medication Sig Start Date End Date Taking? Authorizing Provider  amiodarone (PACERONE) 200 MG tablet Take 200 mg by mouth daily.   Yes Historical Provider, MD  aspirin 81 MG tablet Take 81 mg by mouth daily.   Yes Historical Provider, MD  atorvastatin (LIPITOR) 40 MG tablet Take 40 mg by mouth daily.   Yes Historical Provider, MD  metoprolol (LOPRESSOR)  50 MG tablet Take 50 mg by mouth 2 (two) times daily.   Yes Historical Provider, MD  warfarin (COUMADIN) 5 MG tablet Take 5 mg by mouth daily.   Yes Historical Provider, MD  allopurinol (ZYLOPRIM) 100 MG tablet Take 300 mg by mouth daily.     Historical Provider, MD  aspirin 325 MG tablet Take 325 mg by mouth daily.    Historical Provider, MD  losartan (COZAAR) 100 MG tablet Take 100 mg by mouth daily.    Historical Provider, MD  niacin (NIASPAN) 1000 MG CR tablet Take 1,000 mg by mouth at bedtime.    Historical Provider, MD   Meds Ordered and Administered this Visit  Medications - No data to display  BP 143/80 mmHg  Pulse 76  Temp(Src) 99.5 F (37.5 C) (Tympanic)  Resp 17  Ht 5\' 9"  (1.753 m)  Wt 178 lb (80.74 kg)  BMI 26.27 kg/m2  SpO2 98% No data found.   Physical Exam  Constitutional: He appears well-developed and well-nourished. No distress.  HENT:  Nose: Nose normal.  Mouth/Throat: Oropharynx is clear and moist.  Cardiovascular: Normal rate.   Pulmonary/Chest: Effort normal. No respiratory distress.  Musculoskeletal:  Right elbow subcutaneous hematoma; normal range of motion; non-tender  Skin: He is not diaphoretic.  Nursing note and vitals reviewed.   ED Course  Procedures (including critical care time)  Labs Review Labs Reviewed  URINALYSIS COMPLETEWITH MICROSCOPIC ([place]) - Abnormal; Notable for the following:    Color, Urine RED (*)  APPearance TURBID (*)    Glucose, UA   (*)    Value: TEST NOT REPORTED DUE TO COLOR INTERFERENCE OF URINE PIGMENT   Bilirubin Urine   (*)    Value: TEST NOT REPORTED DUE TO COLOR INTERFERENCE OF URINE PIGMENT   Ketones, ur   (*)    Value: TEST NOT REPORTED DUE TO COLOR INTERFERENCE OF URINE PIGMENT   Specific Gravity, Urine 0.000 (*)    Hgb urine dipstick   (*)    Value: TEST NOT REPORTED DUE TO COLOR INTERFERENCE OF URINE PIGMENT   pH 0.0 (*)    Protein, ur   (*)    Value: TEST NOT REPORTED DUE TO COLOR INTERFERENCE  OF URINE PIGMENT   Nitrite   (*)    Value: TEST NOT REPORTED DUE TO COLOR INTERFERENCE OF URINE PIGMENT   Leukocytes, UA   (*)    Value: TEST NOT REPORTED DUE TO COLOR INTERFERENCE OF URINE PIGMENT   Bacteria, UA RARE (*)    All other components within normal limits  PROTIME-INR - Abnormal; Notable for the following:    Prothrombin Time 95.2 (*)    INR 13.56 (*)    All other components within normal limits    Imaging Review No results found.   Visual Acuity Review  Right Eye Distance:   Left Eye Distance:   Bilateral Distance:    Right Eye Near:   Left Eye Near:    Bilateral Near:         MDM   1. Excessive anticoagulation   2. Elevated INR   3. Gross hematuria    1. Lab results and diagnosis reviewed with patient; due to symptoms and elevated INR recommend patient go to ED for further evaluation and management; patient verbalizes understanding; patient in stable condition and be driven to ED by wife in private vehicle; triage RN at [facility] ED notified  Norval Gable, MD 05/08/15 [place]

## 2015-05-13 DIAGNOSIS — M7021 Olecranon bursitis, right elbow: Secondary | ICD-10-CM | POA: Diagnosis not present

## 2015-05-13 DIAGNOSIS — R0982 Postnasal drip: Secondary | ICD-10-CM | POA: Diagnosis not present

## 2015-05-21 ENCOUNTER — Encounter: Payer: Medicare Other | Attending: Internal Medicine | Admitting: *Deleted

## 2015-05-21 DIAGNOSIS — Z8679 Personal history of other diseases of the circulatory system: Secondary | ICD-10-CM | POA: Diagnosis not present

## 2015-05-21 DIAGNOSIS — Z952 Presence of prosthetic heart valve: Secondary | ICD-10-CM

## 2015-05-21 DIAGNOSIS — Z7901 Long term (current) use of anticoagulants: Secondary | ICD-10-CM | POA: Diagnosis not present

## 2015-05-21 DIAGNOSIS — Z9889 Other specified postprocedural states: Secondary | ICD-10-CM | POA: Diagnosis not present

## 2015-05-21 NOTE — Patient Instructions (Signed)
Patient Instructions  Patient Details  Name: Luke Gonzales MRN: [id number] Date of Birth: 03/08/1940 Referring Provider:  Corey Skains, MD  Below are the personal goals you chose as well as exercise and nutrition goals. Our goal is to help you keep on track towards obtaining and maintaining your goals. We will be discussing your progress on these goals with you throughout the program.  Initial Exercise Prescription:     Initial Exercise Prescription - 05/21/15 1400    Date of Initial Exercise RX and Referring Provider   Date 05/21/15   Treadmill   MPH 2   Grade 0   Minutes 15   Recumbant Bike   Level 2   Watts 20   Minutes 15   NuStep   Level 2   Watts 40   Minutes 15   Recumbant Elliptical   Level 2   Watts 20   Minutes 15   REL-XR   Level 2   Watts 50   Minutes 15   T5 Nustep   Level 1   Watts 50   Minutes 15   Biostep-RELP   Level 1   Watts 15   Prescription Details   Frequency (times per week) 3   Duration Progress to 50 minutes of aerobic without signs/symptoms of physical distress   Intensity   THRR REST +  30   Ratings of Perceived Exertion 11-15   Perceived Dyspnea 0-4   Progression   Progression Continue to progress workloads to maintain intensity without signs/symptoms of physical distress.   Resistance Training   Training Prescription Yes   Weight 2   Reps 10-15      Exercise Goals: Frequency: Be able to perform aerobic exercise three times per week working toward 3-5 days per week.  Intensity: Work with a perceived exertion of 11 (fairly light) - 15 (hard) as tolerated. Follow your new exercise prescription and watch for changes in prescription as you progress with the program. Changes will be reviewed with you when they are made.  Duration: You should be able to do 30 minutes of continuous aerobic exercise in addition to a 5 minute warm-up and a 5 minute cool-down routine.  Nutrition Goals: Your personal nutrition goals will be  established when you do your nutrition analysis with the dietician.  The following are nutrition guidelines to follow: Cholesterol < 200mg /day Sodium < 1500mg /day Fiber: Men over 50 yrs - 30 grams per day  Personal Goals:     Personal Goals and Risk Factors at Admission - 05/21/15 1334    Core Components/Risk Factors/Patient Goals on Admission   Sedentary Yes   Intervention Provide advice, education, support and counseling about physical activity/exercise needs.;Develop an individualized exercise prescription for aerobic and resistive training based on initial evaluation findings, risk stratification, comorbidities and participant's personal goals.   Expected Outcomes Achievement of increased cardiorespiratory fitness and enhanced flexibility, muscular endurance and strength shown through measurements of functional capacity and personal statement of participant.   Increase Strength and Stamina Yes   Intervention Provide advice, education, support and counseling about physical activity/exercise needs.;Develop an individualized exercise prescription for aerobic and resistive training based on initial evaluation findings, risk stratification, comorbidities and participant's personal goals.   Expected Outcomes Achievement of increased cardiorespiratory fitness and enhanced flexibility, muscular endurance and strength shown through measurements of functional capacity and personal statement of participant.   Lipids Yes   Intervention Provide education and support for participant on nutrition & aerobic/resistive exercise along with prescribed  medications to achieve LDL 70mg , HDL >40mg .   Expected Outcomes Short Term: Participant states understanding of desired cholesterol values and is compliant with medications prescribed. Participant is following exercise prescription and nutrition guidelines.;Long Term: Cholesterol controlled with medications as prescribed, with individualized exercise RX and with  personalized nutrition plan. Value goals: LDL < 70mg , HDL > 40 mg.      Tobacco Use Initial Evaluation: History  Smoking status  . Former Smoker -- 15 years  . Types: Cigarettes  Smokeless tobacco  . Not on file    Copy of goals given to participant.

## 2015-05-21 NOTE — Progress Notes (Signed)
Cardiac Individual Treatment Plan  Patient Details  Name: Luke Gonzales MRN: [id number] Date of Birth: 1940-04-27 Referring Provider:    Initial Encounter Date:       Cardiac Rehab from 05/21/2015 in [facility]   Date  05/21/15      Visit Diagnosis: H/O aortic valve replacement  Patient's Home Medications on Admission:  Current outpatient prescriptions:  .  allopurinol (ZYLOPRIM) 300 MG tablet, Take 300 mg by mouth daily., Disp: , Rfl:  .  amiodarone (PACERONE) 200 MG tablet, Take 200 mg by mouth 2 (two) times daily. , Disp: , Rfl:  .  atorvastatin (LIPITOR) 40 MG tablet, Take 40 mg by mouth every evening. , Disp: , Rfl:  .  cetirizine (ZYRTEC) 10 MG tablet, Take 10 mg by mouth daily., Disp: , Rfl:  .  metoprolol (LOPRESSOR) 50 MG tablet, Take 50 mg by mouth 2 (two) times daily., Disp: , Rfl:  .  niacin (NIASPAN) 1000 MG CR tablet, Take 1,000 mg by mouth at bedtime., Disp: , Rfl:  .  aspirin 81 MG chewable tablet, Chew 81 mg by mouth at bedtime. Reported on 05/21/2015, Disp: , Rfl:  .  oxyCODONE (OXY IR/ROXICODONE) 5 MG immediate release tablet, Take 5 mg by mouth every 6 (six) hours as needed for severe pain., Disp: , Rfl:  .  warfarin (COUMADIN) 5 MG tablet, Take 5 mg by mouth every evening. Reported on 05/21/2015, Disp: , Rfl:   Past Medical History: Past Medical History  Diagnosis Date  . Hypertension   . Shortness of breath dyspnea   . Coronary artery disease   . Aortic stenosis s/p bicupspid valve  . BPH (benign prostatic hypertrophy)   . Gout   . Hyperlipemia     Tobacco Use: History  Smoking status  . Former Smoker -- 15 years  . Types: Cigarettes  Smokeless tobacco  . Not on file    Labs: Recent Review Flowsheet Data    There is no flowsheet data to display.       Exercise Target Goals: Date: 05/21/15  Exercise Program Goal: Individual exercise prescription set with THRR, safety & activity barriers. Participant demonstrates  ability to understand and report RPE using BORG scale, to self-measure pulse accurately, and to acknowledge the importance of the exercise prescription.  Exercise Prescription Goal: Starting with aerobic activity 30 plus minutes a day, 3 days per week for initial exercise prescription. Provide home exercise prescription and guidelines that participant acknowledges understanding prior to discharge.  Activity Barriers & Risk Stratification:     Activity Barriers & Cardiac Risk Stratification - 05/21/15 1327    Activity Barriers & Cardiac Risk Stratification   Activity Barriers Joint Problems  Right shoulder arthritis.  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 HR 92 bpm     Max Ex. BP 162/78 mmHg     2 Minute Post BP  130/66 mmHg    Interval Oxygen   Interval Oxygen? Yes  Baseline Oxygen Saturation % 97 %     6 Minute Oxygen Saturation % 95 %        Initial Exercise Prescription:     Initial Exercise Prescription - 05/21/15 1400    Date of Initial Exercise RX and Referring Provider   Date 05/21/15   Treadmill   MPH 2   Grade 0   Minutes 15   Recumbant Bike   Level 2   Watts 20   Minutes 15   NuStep   Level 2   Watts 40   Minutes 15   Recumbant Elliptical   Level 2   Watts 20   Minutes 15   REL-XR   Level 2   Watts 50   Minutes 15   T5 Nustep   Level 1   Watts 50   Minutes 15   Biostep-RELP   Level 1   Watts 15   Prescription Details   Frequency (times per week) 3   Duration Progress to 50 minutes of aerobic without signs/symptoms of physical distress   Intensity   THRR REST +  30   Ratings of Perceived Exertion 11-15   Perceived Dyspnea 0-4   Progression   Progression  Continue to progress workloads to maintain intensity without signs/symptoms of physical distress.   Resistance Training   Training Prescription Yes   Weight 2   Reps 10-15      Perform Capillary Blood Glucose checks as needed.  Exercise Prescription Changes:     Exercise Prescription Changes      05/21/15 1400           Response to Exercise   Blood Pressure (Admit) 138/74 mmHg       Blood Pressure (Exercise) 162/78 mmHg       Blood Pressure (Exit) 130/66 mmHg       Heart Rate (Admit) 67 bpm       Heart Rate (Exercise) 92 bpm       Heart Rate (Exit) 73 bpm       Perceived Dyspnea (Exercise) 12          Exercise Comments:   Discharge Exercise Prescription (Final Exercise Prescription Changes):     Exercise Prescription Changes - 05/21/15 1400    Response to Exercise   Blood Pressure (Admit) 138/74 mmHg   Blood Pressure (Exercise) 162/78 mmHg   Blood Pressure (Exit) 130/66 mmHg   Heart Rate (Admit) 67 bpm   Heart Rate (Exercise) 92 bpm   Heart Rate (Exit) 73 bpm   Perceived Dyspnea (Exercise) 12      Nutrition:  Target Goals: Understanding of nutrition guidelines, daily intake of sodium 1500mg , cholesterol 200mg , calories 30% from fat and 7% or less from saturated fats, daily to have 5 or more servings of fruits and vegetables.  Biometrics:     Pre Biometrics - 05/21/15 1452    Pre Biometrics   Height 5\' 10"  (1.778 m)   Weight 177 lb 9.6 oz (80.559 kg)   Waist Circumference 39.5 inches   Hip Circumference 39.5 inches   Waist to Hip Ratio 1 %   BMI (Calculated) 25.5       Nutrition Therapy Plan and Nutrition Goals:     Nutrition Therapy & Goals - 05/21/15 1332    Intervention Plan   Intervention Prescribe, educate and counsel regarding individualized specific dietary modifications aiming towards targeted core components such as weight, hypertension, lipid management, diabetes, heart failure and other comorbidities.   Expected Outcomes Short Term  Goal: Understand basic principles of dietary content, such as calories, fat, sodium, cholesterol and nutrients.;Long Term Goal: Adherence to prescribed nutrition plan.;Short Term Goal: A plan has been developed with personal nutrition goals set during dietitian appointment.      Nutrition Discharge: Rate Your Plate Scores:   Nutrition Goals Re-Evaluation:   Psychosocial: Target Goals: Acknowledge presence or absence of depression, maximize coping skills, provide positive support system. Participant is able to verbalize types and ability to use techniques and skills needed for reducing stress and depression.  Initial Review & Psychosocial Screening:     Initial Psych Review & Screening - 05/21/15 1334    Family Dynamics   Good Support System? Yes   Barriers   Psychosocial barriers to participate in program There are no identifiable barriers or psychosocial needs.;The patient should benefit from training in stress management and relaxation.   Screening Interventions   Interventions Encouraged to exercise      Quality of Life Scores:   PHQ-9:     Recent Review Flowsheet Data    Depression screen [facility] 2/9 05/21/2015   Decreased Interest 0   Down, Depressed, Hopeless 0   PHQ - 2 Score 0   Altered sleeping 0   Tired, decreased energy 1   Change in appetite 2    Feeling bad or failure about yourself  0   Trouble concentrating 0   Moving slowly or fidgety/restless 0   Suicidal thoughts 0   PHQ-9 Score 3   Difficult doing work/chores Not difficult at all      Psychosocial Evaluation and Intervention:   Psychosocial Re-Evaluation:   Vocational Rehabilitation: Provide vocational rehab assistance to qualifying candidates.   Vocational Rehab Evaluation & Intervention:     Vocational Rehab - 05/21/15 1323    Initial Vocational Rehab Evaluation & Intervention   Assessment shows need for Vocational Rehabilitation No      Education: Education Goals: Education classes  will be provided on a weekly basis, covering required topics. Participant will state understanding/return demonstration of topics presented.  Learning Barriers/Preferences:     Learning Barriers/Preferences - 05/21/15 1323    Learning Barriers/Preferences   Learning Barriers Hearing   Learning Preferences Individual Instruction      Education Topics: General Nutrition Guidelines/Fats and Fiber: -Group instruction provided by verbal, written material, models and posters to present the general guidelines for heart healthy nutrition. Gives an explanation and review of dietary fats and fiber.   Controlling Sodium/Reading Food Labels: -Group verbal and written material supporting the discussion of sodium use in heart healthy nutrition. Review and explanation with models, verbal and written materials for utilization of the food label.   Exercise Physiology & Risk Factors: - Group verbal and written instruction with models to review the exercise physiology of the cardiovascular system and associated critical values. Details cardiovascular disease risk factors and the goals associated with each risk factor.   Aerobic Exercise & Resistance Training: - Gives group verbal and written discussion on the health impact of inactivity. On the components of aerobic and resistive training programs and the benefits of this training and how to safely progress through these programs.   Flexibility, Balance, General Exercise Guidelines: - Provides group verbal and written instruction on the benefits of flexibility and balance training programs. Provides general exercise guidelines with specific guidelines to those with heart or lung disease. Demonstration and skill practice provided.   Stress Management: - Provides group verbal and written instruction about the health risks of elevated stress, cause  of high stress, and healthy ways to reduce stress.   Depression: - Provides group verbal and written  instruction on the correlation between heart/lung disease and depressed mood, treatment options, and the stigmas associated with seeking treatment.   Anatomy & Physiology of the Heart: - Group verbal and written instruction and models provide basic cardiac anatomy and physiology, with the coronary electrical and arterial systems. Review of: AMI, Angina, Valve disease, Heart Failure, Cardiac Arrhythmia, Pacemakers, and the ICD.   Cardiac Procedures: - Group verbal and written instruction and models to describe the testing methods done to diagnose heart disease. Reviews the outcomes of the test results. Describes the treatment choices: Medical Management, Angioplasty, or Coronary Bypass Surgery.   Cardiac Medications: - Group verbal and written instruction to review commonly prescribed medications for heart disease. Reviews the medication, class of the drug, and side effects. Includes the steps to properly store meds and maintain the prescription regimen.   Go Sex-Intimacy & Heart Disease, Get SMART - Goal Setting: - Group verbal and written instruction through game format to discuss heart disease and the return to sexual intimacy. Provides group verbal and written material to discuss and apply goal setting through the application of the S.M.A.R.T. Method.   Other Matters of the Heart: - Provides group verbal, written materials and models to describe Heart Failure, Angina, Valve Disease, and Diabetes in the realm of heart disease. Includes description of the disease process and treatment options available to the cardiac patient.   Exercise & Equipment Safety: - Individual verbal instruction and demonstration of equipment use and safety with use of the equipment.          Cardiac Rehab from 05/21/2015 in [facility]   Date  05/21/15   Educator  SB   Instruction Review Code  2- meets goals/outcomes      Infection Prevention: - Provides verbal and written material to  individual with discussion of infection control including proper hand washing and proper equipment cleaning during exercise session.      Cardiac Rehab from 05/21/2015 in [facility]   Date  05/21/15   Educator  SB   Instruction Review Code  2- meets goals/outcomes      Falls Prevention: - Provides verbal and written material to individual with discussion of falls prevention and safety.      Cardiac Rehab from 05/21/2015 in [facility]   Date  05/21/15   Educator  SB   Instruction Review Code  2- meets goals/outcomes      Diabetes: - Individual verbal and written instruction to review signs/symptoms of diabetes, desired ranges of glucose level fasting, after meals and with exercise. Advice that pre and post exercise glucose checks will be done for 3 sessions at entry of program.    Knowledge Questionnaire Score:   Core Components/Risk Factors/Patient Goals at Admission:     Personal Goals and Risk Factors at Admission - 05/21/15 1334    Core Components/Risk Factors/Patient Goals on Admission   Sedentary Yes   Intervention Provide advice, education, support and counseling about physical activity/exercise needs.;Develop an individualized exercise prescription for aerobic and resistive training based on initial evaluation findings, risk stratification, comorbidities and participant's personal goals.   Expected Outcomes Achievement of increased cardiorespiratory fitness and enhanced flexibility, muscular endurance and strength shown through measurements of functional capacity and personal statement of participant.   Increase Strength and Stamina Yes   Intervention Provide advice, education, support and  counseling about physical activity/exercise needs.;Develop an individualized exercise prescription for aerobic and resistive training based on initial evaluation findings, risk stratification, comorbidities and participant's personal goals.    Expected Outcomes Achievement of increased cardiorespiratory fitness and enhanced flexibility, muscular endurance and strength shown through measurements of functional capacity and personal statement of participant.   Lipids Yes   Intervention Provide education and support for participant on nutrition & aerobic/resistive exercise along with prescribed medications to achieve LDL 70mg , HDL >40mg .   Expected Outcomes Short Term: Participant states understanding of desired cholesterol values and is compliant with medications prescribed. Participant is following exercise prescription and nutrition guidelines.;Long Term: Cholesterol controlled with medications as prescribed, with individualized exercise RX and with personalized nutrition plan. Value goals: LDL < 70mg , HDL > 40 mg.      Core Components/Risk Factors/Patient Goals Review:    Core Components/Risk Factors/Patient Goals at Discharge (Final Review):    ITP Comments:     ITP Comments      05/21/15 1459           ITP Comments medical review completed   Initial ITP  Continue with ITP          Comments:

## 2015-05-26 ENCOUNTER — Encounter: Payer: Self-pay | Admitting: *Deleted

## 2015-05-26 DIAGNOSIS — Z952 Presence of prosthetic heart valve: Secondary | ICD-10-CM

## 2015-05-26 NOTE — Progress Notes (Signed)
Cardiac Individual Treatment Plan  Patient Details  Name: Luke Gonzales MRN: [id number] Date of Birth: 07-05-1940 Referring Provider:    Initial Encounter Date:       Cardiac Rehab from 05/21/2015 in [facility] Cardiac and Pulmonary Rehab   Date  05/21/15      Visit Diagnosis: H/O aortic valve replacement  Patient's Home Medications on Admission:  Current outpatient prescriptions:  .  allopurinol (ZYLOPRIM) 300 MG tablet, Take 300 mg by mouth daily., Disp: , Rfl:  .  amiodarone (PACERONE) 200 MG tablet, Take 200 mg by mouth 2 (two) times daily. , Disp: , Rfl:  .  aspirin 81 MG chewable tablet, Chew 81 mg by mouth at bedtime. Reported on 05/21/2015, Disp: , Rfl:  .  atorvastatin (LIPITOR) 40 MG tablet, Take 40 mg by mouth every evening. , Disp: , Rfl:  .  cetirizine (ZYRTEC) 10 MG tablet, Take 10 mg by mouth daily., Disp: , Rfl:  .  metoprolol (LOPRESSOR) 50 MG tablet, Take 50 mg by mouth 2 (two) times daily., Disp: , Rfl:  .  niacin (NIASPAN) 1000 MG CR tablet, Take 1,000 mg by mouth at bedtime., Disp: , Rfl:  .  oxyCODONE (OXY IR/ROXICODONE) 5 MG immediate release tablet, Take 5 mg by mouth every 6 (six) hours as needed for severe pain., Disp: , Rfl:  .  warfarin (COUMADIN) 5 MG tablet, Take 5 mg by mouth every evening. Reported on 05/21/2015, Disp: , Rfl:   Past Medical History: Past Medical History  Diagnosis Date  . Hypertension   . Shortness of breath dyspnea   . Coronary artery disease   . Aortic stenosis s/p bicupspid valve  . BPH (benign prostatic hypertrophy)   . Gout   . Hyperlipemia     Tobacco Use: History  Smoking status  . Former Smoker -- 15 years  . Types: Cigarettes  Smokeless tobacco  . Not on file    Labs: Recent Review Flowsheet Data    There is no flowsheet data to display.       Exercise Target Goals:    Exercise Program Goal: Individual exercise prescription set with THRR, safety & activity barriers. Participant demonstrates ability to  understand and report RPE using BORG scale, to self-measure pulse accurately, and to acknowledge the importance of the exercise prescription.  Exercise Prescription Goal: Starting with aerobic activity 30 plus minutes a day, 3 days per week for initial exercise prescription. Provide home exercise prescription and guidelines that participant acknowledges understanding prior to discharge.  Activity Barriers & Risk Stratification:     Activity Barriers & Cardiac Risk Stratification - 05/21/15 1327    Activity Barriers & Cardiac Risk Stratification   Activity Barriers Joint Problems  Right shoulder arthritis.  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 HR 92 bpm     Max Ex. BP 162/78 mmHg     2 Minute Post BP  130/66 mmHg    Interval Oxygen   Interval Oxygen? Yes  Baseline Oxygen Saturation % 97 %     6 Minute Oxygen Saturation % 95 %        Initial Exercise Prescription:     Initial Exercise Prescription - 05/21/15 1400    Date of Initial Exercise RX and Referring Provider   Date 05/21/15   Treadmill   MPH 2   Grade 0   Minutes 15   Recumbant Bike   Level 2   Watts 20   Minutes 15   NuStep   Level 2   Watts 40   Minutes 15   Recumbant Elliptical   Level 2   Watts 20   Minutes 15   REL-XR   Level 2   Watts 50   Minutes 15   T5 Nustep   Level 1   Watts 50   Minutes 15   Biostep-RELP   Level 1   Watts 15   Prescription Details   Frequency (times per week) 3   Duration Progress to 50 minutes of aerobic without signs/symptoms of physical distress   Intensity   THRR REST +  30   Ratings of Perceived Exertion 11-15   Perceived Dyspnea 0-4   Progression   Progression Continue to  progress workloads to maintain intensity without signs/symptoms of physical distress.   Resistance Training   Training Prescription Yes   Weight 2   Reps 10-15      Perform Capillary Blood Glucose checks as needed.  Exercise Prescription Changes:     Exercise Prescription Changes      05/21/15 1400           Response to Exercise   Blood Pressure (Admit) 138/74 mmHg       Blood Pressure (Exercise) 162/78 mmHg       Blood Pressure (Exit) 130/66 mmHg       Heart Rate (Admit) 67 bpm       Heart Rate (Exercise) 92 bpm       Heart Rate (Exit) 73 bpm       Perceived Dyspnea (Exercise) 12          Exercise Comments:   Discharge Exercise Prescription (Final Exercise Prescription Changes):     Exercise Prescription Changes - 05/21/15 1400    Response to Exercise   Blood Pressure (Admit) 138/74 mmHg   Blood Pressure (Exercise) 162/78 mmHg   Blood Pressure (Exit) 130/66 mmHg   Heart Rate (Admit) 67 bpm   Heart Rate (Exercise) 92 bpm   Heart Rate (Exit) 73 bpm   Perceived Dyspnea (Exercise) 12      Nutrition:  Target Goals: Understanding of nutrition guidelines, daily intake of sodium 1500mg , cholesterol 200mg , calories 30% from fat and 7% or less from saturated fats, daily to have 5 or more servings of fruits and vegetables.  Biometrics:     Pre Biometrics - 05/21/15 1452    Pre Biometrics   Height 5\' 10"  (1.778 m)   Weight 177 lb 9.6 oz (80.559 kg)   Waist Circumference 39.5 inches   Hip Circumference 39.5 inches   Waist to Hip Ratio 1 %   BMI (Calculated) 25.5       Nutrition Therapy Plan and Nutrition Goals:     Nutrition Therapy & Goals - 05/21/15 1332    Intervention Plan   Intervention Prescribe, educate and counsel regarding individualized specific dietary modifications aiming towards targeted core components such as weight, hypertension, lipid management, diabetes, heart failure and other comorbidities.   Expected Outcomes Short Term  Goal:  Understand basic principles of dietary content, such as calories, fat, sodium, cholesterol and nutrients.;Long Term Goal: Adherence to prescribed nutrition plan.;Short Term Goal: A plan has been developed with personal nutrition goals set during dietitian appointment.      Nutrition Discharge: Rate Your Plate Scores:   Nutrition Goals Re-Evaluation:   Psychosocial: Target Goals: Acknowledge presence or absence of depression, maximize coping skills, provide positive support system. Participant is able to verbalize types and ability to use techniques and skills needed for reducing stress and depression.  Initial Review & Psychosocial Screening:     Initial Psych Review & Screening - 05/21/15 1334    Family Dynamics   Good Support System? Yes   Barriers   Psychosocial barriers to participate in program There are no identifiable barriers or psychosocial needs.;The patient should benefit from training in stress management and relaxation.   Screening Interventions   Interventions Encouraged to exercise      Quality of Life Scores:     Quality of Life - 05/22/15 1348    Quality of Life Scores   Health/Function Pre 23.5 %   Socioeconomic Pre 23.33 %   Psych/Spiritual Pre 22.5 %   Family Pre 25.9 %   GLOBAL Pre 23.63 %      PHQ-9:     Recent Review Flowsheet Data    Depression screen [facility] 2/9 05/21/2015   Decreased Interest 0   Down, Depressed, Hopeless 0   PHQ - 2 Score 0   Altered sleeping 0   Tired, decreased energy 1   Change in appetite 2    Feeling bad or failure about yourself  0   Trouble concentrating 0   Moving slowly or fidgety/restless 0   Suicidal thoughts 0   PHQ-9 Score 3   Difficult doing work/chores Not difficult at all      Psychosocial Evaluation and Intervention:   Psychosocial Re-Evaluation:   Vocational Rehabilitation: Provide vocational rehab assistance to qualifying candidates.   Vocational Rehab Evaluation & Intervention:      Vocational Rehab - 05/21/15 1323    Initial Vocational Rehab Evaluation & Intervention   Assessment shows need for Vocational Rehabilitation No      Education: Education Goals: Education classes will be provided on a weekly basis, covering required topics. Participant will state understanding/return demonstration of topics presented.  Learning Barriers/Preferences:     Learning Barriers/Preferences - 05/21/15 1323    Learning Barriers/Preferences   Learning Barriers Hearing   Learning Preferences Individual Instruction      Education Topics: General Nutrition Guidelines/Fats and Fiber: -Group instruction provided by verbal, written material, models and posters to present the general guidelines for heart healthy nutrition. Gives an explanation and review of dietary fats and fiber.   Controlling Sodium/Reading Food Labels: -Group verbal and written material supporting the discussion of sodium use in heart healthy nutrition. Review and explanation with models, verbal and written materials for utilization of the food label.   Exercise Physiology & Risk Factors: - Group verbal and written instruction with models to review the exercise physiology of the cardiovascular system and associated critical values. Details cardiovascular disease risk factors and the goals associated with each risk factor.   Aerobic Exercise & Resistance Training: - Gives group verbal and written discussion on the health impact of inactivity. On the components of aerobic and resistive training programs and the benefits of this training and how to safely progress through these programs.   Flexibility, Balance, General Exercise Guidelines: - Provides group  verbal and written instruction on the benefits of flexibility and balance training programs. Provides general exercise guidelines with specific guidelines to those with heart or lung disease. Demonstration and skill practice provided.   Stress  Management: - Provides group verbal and written instruction about the health risks of elevated stress, cause of high stress, and healthy ways to reduce stress.   Depression: - Provides group verbal and written instruction on the correlation between heart/lung disease and depressed mood, treatment options, and the stigmas associated with seeking treatment.   Anatomy & Physiology of the Heart: - Group verbal and written instruction and models provide basic cardiac anatomy and physiology, with the coronary electrical and arterial systems. Review of: AMI, Angina, Valve disease, Heart Failure, Cardiac Arrhythmia, Pacemakers, and the ICD.   Cardiac Procedures: - Group verbal and written instruction and models to describe the testing methods done to diagnose heart disease. Reviews the outcomes of the test results. Describes the treatment choices: Medical Management, Angioplasty, or Coronary Bypass Surgery.   Cardiac Medications: - Group verbal and written instruction to review commonly prescribed medications for heart disease. Reviews the medication, class of the drug, and side effects. Includes the steps to properly store meds and maintain the prescription regimen.   Go Sex-Intimacy & Heart Disease, Get SMART - Goal Setting: - Group verbal and written instruction through game format to discuss heart disease and the return to sexual intimacy. Provides group verbal and written material to discuss and apply goal setting through the application of the S.M.A.R.T. Method.   Other Matters of the Heart: - Provides group verbal, written materials and models to describe Heart Failure, Angina, Valve Disease, and Diabetes in the realm of heart disease. Includes description of the disease process and treatment options available to the cardiac patient.   Exercise & Equipment Safety: - Individual verbal instruction and demonstration of equipment use and safety with use of the equipment.          Cardiac  Rehab from 05/21/2015 in [facility] Cardiac and Pulmonary Rehab   Date  05/21/15   Educator  SB   Instruction Review Code  2- meets goals/outcomes      Infection Prevention: - Provides verbal and written material to individual with discussion of infection control including proper hand washing and proper equipment cleaning during exercise session.      Cardiac Rehab from 05/21/2015 in [facility] Cardiac and Pulmonary Rehab   Date  05/21/15   Educator  SB   Instruction Review Code  2- meets goals/outcomes      Falls Prevention: - Provides verbal and written material to individual with discussion of falls prevention and safety.      Cardiac Rehab from 05/21/2015 in [facility] Cardiac and Pulmonary Rehab   Date  05/21/15   Educator  SB   Instruction Review Code  2- meets goals/outcomes      Diabetes: - Individual verbal and written instruction to review signs/symptoms of diabetes, desired ranges of glucose level fasting, after meals and with exercise. Advice that pre and post exercise glucose checks will be done for 3 sessions at entry of program.    Knowledge Questionnaire Score:     Knowledge Questionnaire Score - 05/22/15 1348    Knowledge Questionnaire Score   Pre Score 21/28      Core Components/Risk Factors/Patient Goals at Admission:     Personal Goals and Risk Factors at Admission - 05/21/15 1334    Core Components/Risk Factors/Patient Goals on Admission   Sedentary Yes  Intervention Provide advice, education, support and counseling about physical activity/exercise needs.;Develop an individualized exercise prescription for aerobic and resistive training based on initial evaluation findings, risk stratification, comorbidities and participant's personal goals.   Expected Outcomes Achievement of increased cardiorespiratory fitness and enhanced flexibility, muscular endurance and strength shown through measurements of functional capacity and personal statement of participant.   Increase  Strength and Stamina Yes   Intervention Provide advice, education, support and counseling about physical activity/exercise needs.;Develop an individualized exercise prescription for aerobic and resistive training based on initial evaluation findings, risk stratification, comorbidities and participant's personal goals.   Expected Outcomes Achievement of increased cardiorespiratory fitness and enhanced flexibility, muscular endurance and strength shown through measurements of functional capacity and personal statement of participant.   Lipids Yes   Intervention Provide education and support for participant on nutrition & aerobic/resistive exercise along with prescribed medications to achieve LDL 70mg , HDL >40mg .   Expected Outcomes Short Term: Participant states understanding of desired cholesterol values and is compliant with medications prescribed. Participant is following exercise prescription and nutrition guidelines.;Long Term: Cholesterol controlled with medications as prescribed, with individualized exercise RX and with personalized nutrition plan. Value goals: LDL < 70mg , HDL > 40 mg.      Core Components/Risk Factors/Patient Goals Review:    Core Components/Risk Factors/Patient Goals at Discharge (Final Review):    ITP Comments:     ITP Comments      05/21/15 1459 05/26/15 0947         ITP Comments medical review completed   Initial ITP  Continue with ITP 30 day review.  Continue with ITP  New to program         Comments:

## 2015-05-27 ENCOUNTER — Encounter: Payer: Medicare Other | Admitting: *Deleted

## 2015-05-27 DIAGNOSIS — Z952 Presence of prosthetic heart valve: Secondary | ICD-10-CM | POA: Diagnosis not present

## 2015-05-27 NOTE — Progress Notes (Signed)
Daily Session Note  Patient Details  Name: Luke Gonzales MRN: [id number] Date of Birth: [birth year] Referring Provider:    Encounter Date: 05/27/2015  Check In:     Session Check In - 05/27/15 0856    Check-In   Location [facility]-Cardiac & Pulmonary Rehab   Staff Present Earlean Shawl, BS, ACSM CEP, Exercise Physiologist;Rebecca Brayton El, DPT, CEEA;Heath Lark, RN, BSN, CCRP   Medication changes reported     No   Fall or balance concerns reported    Yes   Comments Patient reported feeling dizzy this weekend and stumbled. He caught himself on the sofa and was not injured in this fall.    Warm-up and Cool-down Performed on first and last piece of equipment   Resistance Training Performed Yes   VAD Patient? No   Pain Assessment   Currently in Pain? No/denies   Multiple Pain Sites No         Goals Met:  Independence with exercise equipment Exercise tolerated well No report of cardiac concerns or symptoms Strength training completed today  Goals Unmet:  Not Applicable  Comments: Patient completed exercise prescription and all exercise goals during rehab session. The exercise was tolerated well and the patient is progressing in the program.     Dr. Emily Filbert is Medical Director for [place] and [facility].

## 2015-05-27 NOTE — Progress Notes (Deleted)
Daily Session Note  Patient Details  Name: Luke Gonzales MRN: [id number] Date of Birth: 1940-07-04 Referring Provider:    Encounter Date: 05/27/2015  Check In:     Session Check In - 05/27/15 0856    Check-In   Location [facility]-Cardiac & Pulmonary Rehab   Staff Present Earlean Shawl, BS, ACSM CEP, Exercise Physiologist;Rebecca Brayton El, DPT, CEEA;Heath Lark, RN, BSN, CCRP   Medication changes reported     No   Fall or balance concerns reported    No   Warm-up and Cool-down Performed on first and last piece of equipment   Resistance Training Performed Yes   VAD Patient? No   Pain Assessment   Currently in Pain? No/denies   Multiple Pain Sites No         Goals Met:  Exercise tolerated well Personal goals reviewed No report of cardiac concerns or symptoms Strength training completed today  Goals Unmet:  Not Applicable  Comments: Today was the patient's first day of class. The patient's initial exercise prescription (based on the 6 min walk evaluation) was reviewed with the patient.     Dr. Emily Filbert is Medical Director for [place] and [facility].

## 2015-05-29 ENCOUNTER — Encounter: Payer: Medicare Other | Admitting: *Deleted

## 2015-05-29 DIAGNOSIS — Z952 Presence of prosthetic heart valve: Secondary | ICD-10-CM | POA: Diagnosis not present

## 2015-05-29 NOTE — Progress Notes (Signed)
Daily Session Note  Patient Details  Name: Luke Gonzales MRN: [id number] Date of Birth: 08-09-1940 Referring Provider:    Encounter Date: 05/29/2015  Check In:     Session Check In - 05/29/15 0841    Check-In   Staff Present Nyoka Cowden, RN;Diem Dicocco, RN, BSN, CCRP;Rebecca Sickles, DPT, [place] physician immediately available to respond to emergencies See telemetry face sheet for immediately available ER MD   Medication changes reported     No   Fall or balance concerns reported    No   Warm-up and Cool-down Performed on first and last piece of equipment   VAD Patient? No   Pain Assessment   Currently in Pain? No/denies         Goals Met:  Exercise tolerated well No report of cardiac concerns or symptoms Strength training completed today  Goals Unmet:  Not Applicable  Comments: Doing well with exercise prescription progression.    Dr. Emily Filbert is Medical Director for [place] and [facility].

## 2015-05-31 ENCOUNTER — Encounter: Payer: Medicare Other | Admitting: *Deleted

## 2015-05-31 DIAGNOSIS — Z952 Presence of prosthetic heart valve: Secondary | ICD-10-CM

## 2015-05-31 NOTE — Progress Notes (Signed)
Daily Session Note  Patient Details  Name: Luke Gonzales MRN: [id number] Date of Birth: [birth year] Referring Provider:    Encounter Date: 05/31/2015  Check In:     Session Check In - 05/31/15 0914    Check-In   Staff Present Nyoka Cowden, RN;Ariane Ditullio, RN, BSN, CCRP;Rebecca Sickles, DPT, [place] physician immediately available to respond to emergencies See telemetry face sheet for immediately available ER MD   Medication changes reported     No   Fall or balance concerns reported    No   Warm-up and Cool-down Performed on first and last piece of equipment   VAD Patient? No   Pain Assessment   Currently in Pain? No/denies         Goals Met:  Exercise tolerated well No report of cardiac concerns or symptoms Strength training completed today  Goals Unmet:  Not Applicable  Comments: Doing well with exercise prescription progression.    Dr. Emily Filbert is Medical Director for [place] and [facility].

## 2015-06-03 ENCOUNTER — Encounter: Payer: Medicare Other | Attending: Internal Medicine | Admitting: *Deleted

## 2015-06-03 DIAGNOSIS — Z952 Presence of prosthetic heart valve: Secondary | ICD-10-CM | POA: Diagnosis not present

## 2015-06-03 NOTE — Progress Notes (Signed)
Daily Session Note  Patient Details  Name: Luke Gonzales MRN: [id number] Date of Birth: 12/26/1940 Referring Provider:    Encounter Date: 06/03/2015  Check In:     Session Check In - 06/03/15 1029    Check-In   Staff Present Heath Lark, RN, BSN, Laveda Norman, BS, ACSM CEP, Exercise Physiologist;Carroll Enterkin, RN, BSN   Supervising physician immediately available to respond to emergencies See telemetry face sheet for immediately available ER MD   Medication changes reported     No   Fall or balance concerns reported    No   Warm-up and Cool-down Performed on first and last piece of equipment   VAD Patient? No   Pain Assessment   Currently in Pain? No/denies         Goals Met:  Exercise tolerated well No report of cardiac concerns or symptoms  Goals Unmet:  Not Applicable  Comments: Doing well with exercise prescription progression.    Dr. Emily Filbert is Medical Director for [place] and [facility].

## 2015-06-05 ENCOUNTER — Encounter: Payer: Medicare Other | Admitting: *Deleted

## 2015-06-05 DIAGNOSIS — R0602 Shortness of breath: Secondary | ICD-10-CM | POA: Diagnosis not present

## 2015-06-05 DIAGNOSIS — Z952 Presence of prosthetic heart valve: Secondary | ICD-10-CM

## 2015-06-05 DIAGNOSIS — I480 Paroxysmal atrial fibrillation: Secondary | ICD-10-CM | POA: Diagnosis not present

## 2015-06-05 DIAGNOSIS — Q251 Coarctation of aorta: Secondary | ICD-10-CM | POA: Diagnosis not present

## 2015-06-05 DIAGNOSIS — I10 Essential (primary) hypertension: Secondary | ICD-10-CM | POA: Diagnosis not present

## 2015-06-05 NOTE — Progress Notes (Signed)
Daily Session Note  Patient Details  Name: KAIVEN VESTER MRN: [id number] Date of Birth: 29-Dec-1940 Referring Provider:    Encounter Date: 06/05/2015  Check In:     Session Check In - 06/05/15 0831    Check-In   Location [facility]-Cardiac & Pulmonary Rehab   Staff Present Heath Lark, RN, BSN, CCRP;Carroll Enterkin, RN, Jana Half, RN, BSN   Supervising physician immediately available to respond to emergencies See telemetry face sheet for immediately available ER MD   Medication changes reported     No   Fall or balance concerns reported    No   Warm-up and Cool-down Performed on first and last piece of equipment   Resistance Training Performed Yes   VAD Patient? No   Pain Assessment   Currently in Pain? No/denies         Goals Met:  Proper associated with RPD/PD & O2 Sat Exercise tolerated well No report of cardiac concerns or symptoms Strength training completed today  Goals Unmet:  Not Applicable  Comments:  Patient completed exercise prescription and all exercise goals during rehab session. The exercise was tolerated well and the patient is progressing in the program.    Dr. Emily Filbert is Medical Director for [place] and [facility].

## 2015-06-07 ENCOUNTER — Encounter: Payer: Medicare Other | Admitting: *Deleted

## 2015-06-07 DIAGNOSIS — Z952 Presence of prosthetic heart valve: Secondary | ICD-10-CM | POA: Diagnosis not present

## 2015-06-07 NOTE — Progress Notes (Signed)
Daily Session Note  Patient Details  Name: Luke Gonzales MRN: [id number] Date of Birth: [birth year] Referring Provider:    Encounter Date: 06/07/2015  Check In:     Session Check In - 06/07/15 0915    Check-In   Location [facility]-Cardiac & Pulmonary Rehab   Staff Present Heath Lark, RN, BSN, CCRP;Jody Aguinaga, RN, Alex Gardener, DPT, CEEA   Supervising physician immediately available to respond to emergencies See telemetry face sheet for immediately available ER MD   Medication changes reported     Yes   Fall or balance concerns reported    No   Warm-up and Cool-down Performed on first and last piece of equipment   Resistance Training Performed Yes   VAD Patient? No   Pain Assessment   Currently in Pain? No/denies         Goals Met:  Proper associated with RPD/PD & O2 Sat Personal goals reviewed  Goals Unmet:  Not Applicable  Comments:     Dr. Emily Filbert is Medical Director for [place] and [facility].

## 2015-06-07 NOTE — Progress Notes (Signed)
Cardiac Individual Treatment Plan  Patient Details  Name: Luke Gonzales MRN: [id number] Date of Birth: 19-Jun-1940 Referring Provider:    Initial Encounter Date:       Cardiac Rehab from 05/21/2015 in [facility] Cardiac and Pulmonary Rehab   Date  05/21/15      Visit Diagnosis: H/O aortic valve replacement  Patient's Home Medications on Admission:  Current outpatient prescriptions:  .  allopurinol (ZYLOPRIM) 300 MG tablet, Take 300 mg by mouth daily., Disp: , Rfl:  .  amiodarone (PACERONE) 200 MG tablet, Take 200 mg by mouth 2 (two) times daily. Reported on 06/07/2015, Disp: , Rfl:  .  aspirin 81 MG chewable tablet, Chew 81 mg by mouth at bedtime. Reported on 05/21/2015, Disp: , Rfl:  .  atorvastatin (LIPITOR) 40 MG tablet, Take 40 mg by mouth every evening. , Disp: , Rfl:  .  cetirizine (ZYRTEC) 10 MG tablet, Take 10 mg by mouth daily., Disp: , Rfl:  .  metoprolol (LOPRESSOR) 50 MG tablet, Take 50 mg by mouth 2 (two) times daily., Disp: , Rfl:  .  niacin (NIASPAN) 1000 MG CR tablet, Take 1,000 mg by mouth at bedtime., Disp: , Rfl:  .  oxyCODONE (OXY IR/ROXICODONE) 5 MG immediate release tablet, Take 5 mg by mouth every 6 (six) hours as needed for severe pain., Disp: , Rfl:  .  warfarin (COUMADIN) 5 MG tablet, Take 5 mg by mouth every evening. Reported on 05/21/2015, Disp: , Rfl:   Past Medical History: Past Medical History  Diagnosis Date  . Hypertension   . Shortness of breath dyspnea   . Coronary artery disease   . Aortic stenosis s/p bicupspid valve  . BPH (benign prostatic hypertrophy)   . Gout   . Hyperlipemia     Tobacco Use: History  Smoking status  . Former Smoker -- 15 years  . Types: Cigarettes  Smokeless tobacco  . Not on file    Labs: Recent Review Flowsheet Data    There is no flowsheet data to display.       Exercise Target Goals:    Exercise Program Goal: Individual exercise prescription set with THRR, safety & activity barriers. Participant  demonstrates ability to understand and report RPE using BORG scale, to self-measure pulse accurately, and to acknowledge the importance of the exercise prescription.  Exercise Prescription Goal: Starting with aerobic activity 30 plus minutes a day, 3 days per week for initial exercise prescription. Provide home exercise prescription and guidelines that participant acknowledges understanding prior to discharge.  Activity Barriers & Risk Stratification:     Activity Barriers & Cardiac Risk Stratification - 05/21/15 1327    Activity Barriers & Cardiac Risk Stratification   Activity Barriers Joint Problems  Right shoulder arthritis.  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 HR 92 bpm     Max Ex. BP 162/78 mmHg     2 Minute Post BP  130/66 mmHg    Interval Oxygen   Interval Oxygen?  Yes     Baseline Oxygen Saturation % 97 %     6 Minute Oxygen Saturation % 95 %        Initial Exercise Prescription:     Initial Exercise Prescription - 05/21/15 1400    Date of Initial Exercise RX and Referring Provider   Date 05/21/15   Treadmill   MPH 2   Grade 0   Minutes 15   Recumbant Bike   Level 2   Watts 20   Minutes 15   NuStep   Level 2   Watts 40   Minutes 15   Recumbant Elliptical   Level 2   Watts 20   Minutes 15   REL-XR   Level 2   Watts 50   Minutes 15   T5 Nustep   Level 1   Watts 50   Minutes 15   Biostep-RELP   Level 1   Watts 15   Prescription Details   Frequency (times per week) 3   Duration Progress to 50 minutes of aerobic without signs/symptoms of physical distress   Intensity   THRR REST +  30   Ratings of Perceived Exertion 11-15   Perceived Dyspnea 0-4   Progression    Progression Continue to progress workloads to maintain intensity without signs/symptoms of physical distress.   Resistance Training   Training Prescription Yes   Weight 2   Reps 10-15      Perform Capillary Blood Glucose checks as needed.  Exercise Prescription Changes:     Exercise Prescription Changes      05/21/15 1400           Response to Exercise   Blood Pressure (Admit) 138/74 mmHg       Blood Pressure (Exercise) 162/78 mmHg       Blood Pressure (Exit) 130/66 mmHg       Heart Rate (Admit) 67 bpm       Heart Rate (Exercise) 92 bpm       Heart Rate (Exit) 73 bpm       Perceived Dyspnea (Exercise) 12          Exercise Comments:     Exercise Comments      05/27/15 0857           Exercise Comments Today was the patient's first day of class. The patient's initial exercise prescription (based on the 6 min walk evaluation) was reviewed with the patient.          Discharge Exercise Prescription (Final Exercise Prescription Changes):     Exercise Prescription Changes - 05/21/15 1400    Response to Exercise   Blood Pressure (Admit) 138/74 mmHg   Blood Pressure (Exercise) 162/78 mmHg   Blood Pressure (Exit) 130/66 mmHg   Heart Rate (Admit) 67 bpm   Heart Rate (Exercise) 92 bpm   Heart Rate (Exit) 73 bpm   Perceived Dyspnea (Exercise) 12      Nutrition:  Target Goals: Understanding of nutrition guidelines, daily intake of sodium <1584m, cholesterol <2046m calories 30% from fat and 7% or less from saturated fats, daily to have 5 or more servings of fruits and vegetables.  Biometrics:     Pre Biometrics - 05/21/15 1452    Pre Biometrics   Height _0  (1.778 m)   Weight 177 lb 9.6 oz (80.559 kg)   Waist Circumference 39.5 inches   Hip Circumference 39.5 inches   Waist to Hip Ratio 1 %   BMI (Calculated) 25.5  Nutrition Therapy Plan and Nutrition Goals:     Nutrition Therapy & Goals - 06/03/15 0835    Nutrition Therapy   Diet Instructed  on a heart healthy meal plan based on 2000 calories including DASH diet principles.   Drug/Food Interactions Statins/Certain Fruits   Protein (specify units) 8   Fiber 30 grams   Whole Grain Foods 3 servings   Saturated Fats 13 max. grams   Fruits and Vegetables 5 servings/day   Sodium 2000 grams   Personal Nutrition Goals   Personal Goal #1 Try some trans free margarines such as "I Can't Believe it's not butter" or Smart Balance   Personal Goal #2 Include a protein food with each meal to help reach goal of 8 oz. per day   Personal Goal #3 To use some of the Ms. DASH seasonings.   Intervention Plan   Intervention Prescribe, educate and counsel regarding individualized specific dietary modifications aiming towards targeted core components such as weight, hypertension, lipid management, diabetes, heart failure and other comorbidities.;Nutrition handout(s) given to patient.   Expected Outcomes Short Term Goal: Understand basic principles of dietary content, such as calories, fat, sodium, cholesterol and nutrients.;Short Term Goal: A plan has been developed with personal nutrition goals set during dietitian appointment.;Long Term Goal: Adherence to prescribed nutrition plan.      Nutrition Discharge: Rate Your Plate Scores:     Nutrition Assessments - 05/31/15 0839    Rate Your Plate Scores   Pre Score 61   Pre Score % 68 %      Nutrition Goals Re-Evaluation:     Nutrition Goals Re-Evaluation      06/07/15 0921           Personal Goal #1 Re-Evaluation   Goal Progress Seen No       Comments Gar said he doesn't eat much margaine       Personal Goal #2 Re-Evaluation   Goal Progress Seen Yes       Comments "My appeptite is getting better but still now what it was .        Personal Goal #3 Re-Evaluation   Goal Progress Seen No          Psychosocial: Target Goals: Acknowledge presence or absence of depression, maximize coping skills, provide positive support system.  Participant is able to verbalize types and ability to use techniques and skills needed for reducing stress and depression.  Initial Review & Psychosocial Screening:     Initial Psych Review & Screening - 05/21/15 1334    Family Dynamics   Good Support System? Yes   Barriers   Psychosocial barriers to participate in program There are no identifiable barriers or psychosocial needs.;The patient should benefit from training in stress management and relaxation.   Screening Interventions   Interventions Encouraged to exercise      Quality of Life Scores:     Quality of Life - 05/22/15 1348    Quality of Life Scores   Health/Function Pre 23.5 %   Socioeconomic Pre 23.33 %   Psych/Spiritual Pre 22.5 %   Family Pre 25.9 %   GLOBAL Pre 23.63 %      PHQ-9:     Recent Review Flowsheet Data    Depression screen [facility] 2/9 05/21/2015   Decreased Interest 0   Down, Depressed, Hopeless 0   PHQ - 2 Score 0   Altered sleeping 0   Tired, decreased energy 1   Change in appetite 2  Feeling bad or failure about yourself  0   Trouble concentrating 0   Moving slowly or fidgety/restless 0   Suicidal thoughts 0   PHQ-9 Score 3   Difficult doing work/chores Not difficult at all      Psychosocial Evaluation and Intervention:     Psychosocial Evaluation - 05/27/15 0957    Psychosocial Evaluation & Interventions   Interventions Encouraged to exercise with the program and follow exercise prescription   Comments Counselor met with Mr. Barnette today for initial psychosocial evaluation.  He is a 75 year old who had open heart surgery in early March for aortic valve replacement.  He has a strong support system with a spouse of 87 years and several adult children who live close by.  Mr. Kushner is also actively involved in his local church.  He denies a history of depression or anxiety or current symptoms.  He reports to sleeping well and having a decreased appetite since the surgery.  Mr. Tay  states he is typically in a positive mood.  He has goals to increase his stamina and strength while in this program.        Psychosocial Re-Evaluation:   Vocational Rehabilitation: Provide vocational rehab assistance to qualifying candidates.   Vocational Rehab Evaluation & Intervention:     Vocational Rehab - 05/21/15 1323    Initial Vocational Rehab Evaluation & Intervention   Assessment shows need for Vocational Rehabilitation No      Education: Education Goals: Education classes will be provided on a weekly basis, covering required topics. Participant will state understanding/return demonstration of topics presented.  Learning Barriers/Preferences:     Learning Barriers/Preferences - 05/21/15 1323    Learning Barriers/Preferences   Learning Barriers Hearing   Learning Preferences Individual Instruction      Education Topics: General Nutrition Guidelines/Fats and Fiber: -Group instruction provided by verbal, written material, models and posters to present the general guidelines for heart healthy nutrition. Gives an explanation and review of dietary fats and fiber.   Controlling Sodium/Reading Food Labels: -Group verbal and written material supporting the discussion of sodium use in heart healthy nutrition. Review and explanation with models, verbal and written materials for utilization of the food label.   Exercise Physiology & Risk Factors: - Group verbal and written instruction with models to review the exercise physiology of the cardiovascular system and associated critical values. Details cardiovascular disease risk factors and the goals associated with each risk factor.   Aerobic Exercise & Resistance Training: - Gives group verbal and written discussion on the health impact of inactivity. On the components of aerobic and resistive training programs and the benefits of this training and how to safely progress through these programs.          Cardiac Rehab from  06/05/2015 in [facility] Cardiac and Pulmonary Rehab   Date  05/27/15   Educator  RS   Instruction Review Code  2- meets goals/outcomes      Flexibility, Balance, General Exercise Guidelines: - Provides group verbal and written instruction on the benefits of flexibility and balance training programs. Provides general exercise guidelines with specific guidelines to those with heart or lung disease. Demonstration and skill practice provided.      Cardiac Rehab from 06/05/2015 in [facility] Cardiac and Pulmonary Rehab   Date  05/29/15   Educator  bs   Instruction Review Code  2- meets goals/outcomes      Stress Management: - Provides group verbal and written instruction about the health risks  of elevated stress, cause of high stress, and healthy ways to reduce stress.      Cardiac Rehab from 06/05/2015 in [facility] Cardiac and Pulmonary Rehab   Date  06/05/15   Educator  [facility]   Instruction Review Code  2- meets goals/outcomes      Depression: - Provides group verbal and written instruction on the correlation between heart/lung disease and depressed mood, treatment options, and the stigmas associated with seeking treatment.   Anatomy & Physiology of the Heart: - Group verbal and written instruction and models provide basic cardiac anatomy and physiology, with the coronary electrical and arterial systems. Review of: AMI, Angina, Valve disease, Heart Failure, Cardiac Arrhythmia, Pacemakers, and the ICD.      Cardiac Rehab from 06/05/2015 in [facility] Cardiac and Pulmonary Rehab   Date  06/03/15   Educator  Sb   Instruction Review Code  2- meets goals/outcomes      Cardiac Procedures: - Group verbal and written instruction and models to describe the testing methods done to diagnose heart disease. Reviews the outcomes of the test results. Describes the treatment choices: Medical Management, Angioplasty, or Coronary Bypass Surgery.   Cardiac Medications: - Group verbal and written instruction to review commonly  prescribed medications for heart disease. Reviews the medication, class of the drug, and side effects. Includes the steps to properly store meds and maintain the prescription regimen.   Go Sex-Intimacy & Heart Disease, Get SMART - Goal Setting: - Group verbal and written instruction through game format to discuss heart disease and the return to sexual intimacy. Provides group verbal and written material to discuss and apply goal setting through the application of the S.M.A.R.T. Method.   Other Matters of the Heart: - Provides group verbal, written materials and models to describe Heart Failure, Angina, Valve Disease, and Diabetes in the realm of heart disease. Includes description of the disease process and treatment options available to the cardiac patient.      Cardiac Rehab from 06/05/2015 in [facility] Cardiac and Pulmonary Rehab   Date  06/03/15   Educator  SB   Instruction Review Code  2- meets goals/outcomes      Exercise & Equipment Safety: - Individual verbal instruction and demonstration of equipment use and safety with use of the equipment.      Cardiac Rehab from 06/05/2015 in [facility] Cardiac and Pulmonary Rehab   Date  05/21/15   Educator  SB   Instruction Review Code  2- meets goals/outcomes      Infection Prevention: - Provides verbal and written material to individual with discussion of infection control including proper hand washing and proper equipment cleaning during exercise session.      Cardiac Rehab from 06/05/2015 in [facility] Cardiac and Pulmonary Rehab   Date  05/21/15   Educator  SB   Instruction Review Code  2- meets goals/outcomes      Falls Prevention: - Provides verbal and written material to individual with discussion of falls prevention and safety.      Cardiac Rehab from 06/05/2015 in [facility] Cardiac and Pulmonary Rehab   Date  05/21/15   Educator  SB   Instruction Review Code  2- meets goals/outcomes      Diabetes: - Individual verbal and written instruction to  review signs/symptoms of diabetes, desired ranges of glucose level fasting, after meals and with exercise. Advice that pre and post exercise glucose checks will be done for 3 sessions at entry of program.    Knowledge Questionnaire Score:  Knowledge Questionnaire Score - 05/22/15 1348    Knowledge Questionnaire Score   Pre Score 21/28      Core Components/Risk Factors/Patient Goals at Admission:     Personal Goals and Risk Factors at Admission - 05/21/15 1334    Core Components/Risk Factors/Patient Goals on Admission   Sedentary Yes   Intervention Provide advice, education, support and counseling about physical activity/exercise needs.;Develop an individualized exercise prescription for aerobic and resistive training based on initial evaluation findings, risk stratification, comorbidities and participant's personal goals.   Expected Outcomes Achievement of increased cardiorespiratory fitness and enhanced flexibility, muscular endurance and strength shown through measurements of functional capacity and personal statement of participant.   Increase Strength and Stamina Yes   Intervention Provide advice, education, support and counseling about physical activity/exercise needs.;Develop an individualized exercise prescription for aerobic and resistive training based on initial evaluation findings, risk stratification, comorbidities and participant's personal goals.   Expected Outcomes Achievement of increased cardiorespiratory fitness and enhanced flexibility, muscular endurance and strength shown through measurements of functional capacity and personal statement of participant.   Lipids Yes   Intervention Provide education and support for participant on nutrition & aerobic/resistive exercise along with prescribed medications to achieve LDL <63m, HDL >466m   Expected Outcomes Short Term: Participant states understanding of desired cholesterol values and is compliant with medications  prescribed. Participant is following exercise prescription and nutrition guidelines.;Long Term: Cholesterol controlled with medications as prescribed, with individualized exercise RX and with personalized nutrition plan. Value goals: LDL < 7035mHDL > 40 mg.      Core Components/Risk Factors/Patient Goals Review:      Goals and Risk Factor Review      06/07/15 1003           Core Components/Risk Factors/Patient Goals Review   Personal Goals Review Weight Management/Obesity;Sedentary       Review JamFletcherst 15 lbs with his last surgery and his appetite is not totally back. His weight lifting restriction is off so he will cont to us [place] 4 lb weights. He is doing more at home. JamWilletid he met with the Cardiac Rehab Registered Dietician and said he has already been eating healthy.        Expected Outcomes Cont to exercise in Cardiac Rehab to compelte 36 sessions.           Core Components/Risk Factors/Patient Goals at Discharge (Final Review):      Goals and Risk Factor Review - 06/07/15 1003    Core Components/Risk Factors/Patient Goals Review   Personal Goals Review Weight Management/Obesity;Sedentary   Review JamLanest 15 lbs with his last surgery and his appetite is not totally back. His weight lifting restriction is off so he will cont to us [place]ao 4 lb weights. He is doing more at home. JamSamielid he met with the Cardiac Rehab Registered Dietician and said he has already been eating healthy.    Expected Outcomes Cont to exercise in Cardiac Rehab to compelte 36 sessions.       ITP Comments:     ITP Comments      05/21/15 1459 05/26/15 0947 06/07/15 0915       ITP Comments medical review completed   Initial ITP  Continue with ITP 30 day review.  Continue with ITP  New to program JamCarryid he met with the Cardiac Rehab Registered Dietician and she said his appetite may not come back for a couple of weeks still. He lost 15 lbs with his  past surgery. Mathius "Gretta Cool" said his wife  cooks for him and he is not sure if she has tried Mrs. Dash yet. He said that he doesn't eat much margarine so he has not tried to use Promise or other products yet but he has eaten farily healthy. Woody said MD took him off his weight lifting resistriction but he still wanted to use two 4 lbs weiht today during Cardiac Rehab Resistive training portion.         Comments:

## 2015-06-10 ENCOUNTER — Encounter: Payer: Medicare Other | Admitting: *Deleted

## 2015-06-10 DIAGNOSIS — Z952 Presence of prosthetic heart valve: Secondary | ICD-10-CM | POA: Diagnosis not present

## 2015-06-10 NOTE — Progress Notes (Signed)
Daily Session Note  Patient Details  Name: Luke Gonzales MRN: [id number] Date of Birth: [birth year] Referring Provider:    Encounter Date: 06/10/2015  Check In:     Session Check In - 06/10/15 0845    Check-In   Location [facility]-Cardiac & Pulmonary Rehab   Staff Present Heath Lark, RN, BSN, CCRP;Carroll Enterkin, RN, Moises Blood, BS, ACSM CEP, Exercise Physiologist;Other   Supervising physician immediately available to respond to emergencies See telemetry face sheet for immediately available ER MD   Medication changes reported     No   Fall or balance concerns reported    No   Warm-up and Cool-down Performed on first and last piece of equipment   Resistance Training Performed Yes   VAD Patient? No   Pain Assessment   Currently in Pain? No/denies   Multiple Pain Sites No         Goals Met:  Independence with exercise equipment Exercise tolerated well No report of cardiac concerns or symptoms Strength training completed today  Goals Unmet:  Not Applicable  Comments: Patient completed exercise prescription and all exercise goals during rehab session. The exercise was tolerated well and the patient is progressing in the program.     Dr. Emily Filbert is Medical Director for [place] and [facility].

## 2015-06-12 ENCOUNTER — Encounter: Payer: Medicare Other | Admitting: *Deleted

## 2015-06-12 DIAGNOSIS — Z952 Presence of prosthetic heart valve: Secondary | ICD-10-CM | POA: Diagnosis not present

## 2015-06-12 NOTE — Progress Notes (Signed)
Daily Session Note  Patient Details  Name: ATZIN BUCHTA MRN: [id number] Date of Birth: [birth year] Referring Provider:    Encounter Date: 06/12/2015  Check In:     Session Check In - 06/12/15 0911    Check-In   Staff Present Heath Lark, RN, BSN, CCRP;Laureen Owens Shark, BS, RRT, Respiratory Therapist;Carroll Enterkin, RN, BSN   Supervising physician immediately available to respond to emergencies See telemetry face sheet for immediately available ER MD   Medication changes reported     No   Warm-up and Cool-down Performed on first and last piece of equipment   VAD Patient? No   Pain Assessment   Currently in Pain? No/denies           Exercise Prescription Changes - 06/12/15 0600    Exercise Review   Progression Yes   Response to Exercise   Blood Pressure (Admit) 148/80 mmHg   Blood Pressure (Exercise) 142/82 mmHg   Blood Pressure (Exit) 126/64 mmHg   Heart Rate (Admit) 63 bpm   Heart Rate (Exercise) 78 bpm   Heart Rate (Exit) 68 bpm   Perceived Dyspnea (Exercise) 12   Symptoms none   Duration Progress to 45 minutes of aerobic exercise without signs/symptoms of physical distress   Intensity Rest + 30   Progression   Progression Continue progressive overload as per policy without signs/symptoms or physical distress.   Resistance Training   Training Prescription Yes   Weight 4   Reps 10-15   Interval Training   Interval Training Yes   Equipment Treadmill   Comments 2 minX30 sec intervals.Speed between 2.3/1% and 2.5/2%. Ranging 12-15 RPE. Total 20 min on TM.    Treadmill   MPH 2.5  interval training: see above comment   Grade 2   Minutes 20   Recumbant Bike   Level --   Watts --   Minutes --   NuStep   Level 4   Watts 40   Minutes 20   Recumbant Elliptical   Level --   Watts --   Minutes --   REL-XR   Level --   Watts --   Minutes --   T5 Nustep   Level --   Watts --   Minutes --   Biostep-RELP   Level 3   Watts 25      Goals Met:  Exercise  tolerated well Personal goals reviewed No report of cardiac concerns or symptoms Strength training completed today  Goals Unmet:  Not Applicable  Comments: Doing well with exercise prescription progression.    Dr. Emily Filbert is Medical Director for [place] and [facility].

## 2015-06-14 ENCOUNTER — Encounter: Payer: Medicare Other | Admitting: *Deleted

## 2015-06-14 DIAGNOSIS — Z952 Presence of prosthetic heart valve: Secondary | ICD-10-CM

## 2015-06-14 NOTE — Progress Notes (Signed)
Daily Session Note  Patient Details  Name: Luke Gonzales MRN: [id number] Date of Birth: 06/23/1940 Referring Provider:    Encounter Date: 06/14/2015  Check In:     Session Check In - 06/14/15 0952    Check-In   Location [facility]   Staff Present Earlean Shawl, BS, ACSM CEP, Exercise Physiologist;Susanne Bice, RN, BSN, CCRP;Carroll Enterkin, RN, BSN;Other   Supervising physician immediately available to respond to emergencies See telemetry face sheet for immediately available ER MD   Medication changes reported     No   Fall or balance concerns reported    No   Warm-up and Cool-down Performed on first and last piece of equipment   Resistance Training Performed Yes   VAD Patient? No   Pain Assessment   Currently in Pain? No/denies   Multiple Pain Sites No         Goals Met:  Independence with exercise equipment Exercise tolerated well No report of cardiac concerns or symptoms Strength training completed today  Goals Unmet:  Not Applicable  Comments: Patient completed exercise prescription and all exercise goals during rehab session. The exercise was tolerated well and the patient is progressing in the program.     Dr. Emily Filbert is Medical Director for [facility].

## 2015-06-17 ENCOUNTER — Encounter: Payer: Medicare Other | Admitting: *Deleted

## 2015-06-17 DIAGNOSIS — Z952 Presence of prosthetic heart valve: Secondary | ICD-10-CM

## 2015-06-17 NOTE — Progress Notes (Signed)
Daily Session Note  Patient Details  Name: Luke Gonzales MRN: [id number] Date of Birth: 1940/02/27 Referring Provider:    Encounter Date: 06/17/2015  Check In:     Session Check In - 06/17/15 0848    Check-In   Location [facility]   Staff Present Heath Lark, RN, BSN, Laveda Norman, BS, ACSM CEP, Exercise Physiologist;Carroll Enterkin, RN, BSN;Other   Supervising physician immediately available to respond to emergencies See telemetry face sheet for immediately available ER MD   Medication changes reported     No   Fall or balance concerns reported    No   Warm-up and Cool-down Performed on first and last piece of equipment   Resistance Training Performed Yes   VAD Patient? No   Pain Assessment   Currently in Pain? No/denies   Multiple Pain Sites No         Goals Met:  Independence with exercise equipment Exercise tolerated well No report of cardiac concerns or symptoms Strength training completed today  Goals Unmet:  Not Applicable  Comments: Patient completed exercise prescription and all exercise goals during rehab session. The exercise was tolerated well and the patient is progressing in the program.     Dr. Emily Filbert is Medical Director for [place].

## 2015-06-18 DIAGNOSIS — H2513 Age-related nuclear cataract, bilateral: Secondary | ICD-10-CM | POA: Diagnosis not present

## 2015-06-23 ENCOUNTER — Encounter: Payer: Self-pay | Admitting: *Deleted

## 2015-06-23 DIAGNOSIS — Z952 Presence of prosthetic heart valve: Secondary | ICD-10-CM

## 2015-06-23 NOTE — Progress Notes (Signed)
Cardiac Individual Treatment Plan  Patient Details  Name: Luke Gonzales MRN: [id number] Date of Birth: 19-Jun-1940 Referring Provider:    Initial Encounter Date:       Cardiac Rehab from 05/21/2015 in [facility] Cardiac and Pulmonary Rehab   Date  05/21/15      Visit Diagnosis: H/O aortic valve replacement  Patient's Home Medications on Admission:  Current outpatient prescriptions:  .  allopurinol (ZYLOPRIM) 300 MG tablet, Take 300 mg by mouth daily., Disp: , Rfl:  .  amiodarone (PACERONE) 200 MG tablet, Take 200 mg by mouth 2 (two) times daily. Reported on 06/07/2015, Disp: , Rfl:  .  aspirin 81 MG chewable tablet, Chew 81 mg by mouth at bedtime. Reported on 05/21/2015, Disp: , Rfl:  .  atorvastatin (LIPITOR) 40 MG tablet, Take 40 mg by mouth every evening. , Disp: , Rfl:  .  cetirizine (ZYRTEC) 10 MG tablet, Take 10 mg by mouth daily., Disp: , Rfl:  .  metoprolol (LOPRESSOR) 50 MG tablet, Take 50 mg by mouth 2 (two) times daily., Disp: , Rfl:  .  niacin (NIASPAN) 1000 MG CR tablet, Take 1,000 mg by mouth at bedtime., Disp: , Rfl:  .  oxyCODONE (OXY IR/ROXICODONE) 5 MG immediate release tablet, Take 5 mg by mouth every 6 (six) hours as needed for severe pain., Disp: , Rfl:  .  warfarin (COUMADIN) 5 MG tablet, Take 5 mg by mouth every evening. Reported on 05/21/2015, Disp: , Rfl:   Past Medical History: Past Medical History  Diagnosis Date  . Hypertension   . Shortness of breath dyspnea   . Coronary artery disease   . Aortic stenosis s/p bicupspid valve  . BPH (benign prostatic hypertrophy)   . Gout   . Hyperlipemia     Tobacco Use: History  Smoking status  . Former Smoker -- 15 years  . Types: Cigarettes  Smokeless tobacco  . Not on file    Labs: Recent Review Flowsheet Data    There is no flowsheet data to display.       Exercise Target Goals:    Exercise Program Goal: Individual exercise prescription set with THRR, safety & activity barriers. Participant  demonstrates ability to understand and report RPE using BORG scale, to self-measure pulse accurately, and to acknowledge the importance of the exercise prescription.  Exercise Prescription Goal: Starting with aerobic activity 30 plus minutes a day, 3 days per week for initial exercise prescription. Provide home exercise prescription and guidelines that participant acknowledges understanding prior to discharge.  Activity Barriers & Risk Stratification:     Activity Barriers & Cardiac Risk Stratification - 05/21/15 1327    Activity Barriers & Cardiac Risk Stratification   Activity Barriers Joint Problems  Right shoulder arthritis.  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 HR 92 bpm     Max Ex. BP 162/78 mmHg     2 Minute Post BP  130/66 mmHg    Interval Oxygen   Interval Oxygen?  Yes     Baseline Oxygen Saturation % 97 %     6 Minute Oxygen Saturation % 95 %        Initial Exercise Prescription:     Initial Exercise Prescription - 05/21/15 1400    Date of Initial Exercise RX and Referring Provider   Date 05/21/15   Treadmill   MPH 2   Grade 0   Minutes 15   Recumbant Bike   Level 2   Watts 20   Minutes 15   NuStep   Level 2   Watts 40   Minutes 15   Recumbant Elliptical   Level 2   Watts 20   Minutes 15   REL-XR   Level 2   Watts 50   Minutes 15   T5 Nustep   Level 1   Watts 50   Minutes 15   Biostep-RELP   Level 1   Watts 15   Prescription Details   Frequency (times per week) 3   Duration Progress to 50 minutes of aerobic without signs/symptoms of physical distress   Intensity   THRR REST +  30   Ratings of Perceived Exertion 11-15   Perceived Dyspnea 0-4   Progression    Progression Continue to progress workloads to maintain intensity without signs/symptoms of physical distress.   Resistance Training   Training Prescription Yes   Weight 2   Reps 10-15      Perform Capillary Blood Glucose checks as needed.  Exercise Prescription Changes:     Exercise Prescription Changes      05/21/15 1400 06/12/15 0600         Exercise Review   Progression  Yes      Response to Exercise   Blood Pressure (Admit) 138/74 mmHg 148/80 mmHg      Blood Pressure (Exercise) 162/78 mmHg 142/82 mmHg      Blood Pressure (Exit) 130/66 mmHg 126/64 mmHg      Heart Rate (Admit) 67 bpm 63 bpm      Heart Rate (Exercise) 92 bpm 78 bpm      Heart Rate (Exit) 73 bpm 68 bpm      Perceived Dyspnea (Exercise) 12 12      Symptoms  none      Duration  Progress to 45 minutes of aerobic exercise without signs/symptoms of physical distress      Intensity  Rest + 30      Progression   Progression  Continue progressive overload as per policy without signs/symptoms or physical distress.      Resistance Training   Training Prescription  Yes      Weight  4      Reps  10-15      Interval Training   Interval Training  Yes      Equipment  Treadmill      Comments  2 minX30 sec intervals.Speed between 2.3/1% and 2.5/2%. Ranging 12-15 RPE. Total 20 min on TM.       Treadmill   MPH  2.5  interval training: see above comment      Grade  2      Minutes  20      Recumbant Bike   Level  --      Watts  --      Minutes  --      NuStep   Level  4      Watts  40      Minutes  20  Recumbant Elliptical   Level  --      Watts  --      Minutes  --      REL-XR   Level  --      Watts  --      Minutes  --      T5 Nustep   Level  --      Watts  --      Minutes  --      Biostep-RELP   Level  3      Watts  25         Exercise Comments:     Exercise Comments      05/27/15 0857 06/10/15 1206 06/12/15 0626       Exercise Comments Today was the patient's first day of class. The  patient's initial exercise prescription (based on the 6 min walk evaluation) was reviewed with the patient. Luke Gonzales stated that he is starting to feel stronger since starting cardiac rehab class. He stated that his doctor had told him 8 weeks after surgery he could start doing more. This 8 week post surgery time frame starts tomorrow 06/11/15. He said that he feels like he wants to start trying more daily activities and it was suggested that he try one new activitiy at a time and slowly progress as tolerated. 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  Reviewed individualized exercise prescription and made increases per departmental policy. Exercise increases were discussed with the patient and they were able to perform the new work loads without issue (no signs or symptoms).  The recommendations and benefits of interval training were reviewed with the patient.         Discharge Exercise Prescription (Final Exercise Prescription Changes):     Exercise Prescription Changes - 06/12/15 0600    Exercise Review   Progression Yes   Response to Exercise   Blood Pressure (Admit) 148/80 mmHg   Blood Pressure (Exercise) 142/82 mmHg   Blood Pressure (Exit) 126/64 mmHg   Heart Rate (Admit) 63 bpm   Heart Rate (Exercise) 78 bpm   Heart Rate (Exit) 68 bpm   Perceived Dyspnea (Exercise) 12   Symptoms none   Duration Progress to 45 minutes of aerobic exercise without signs/symptoms of physical distress   Intensity Rest + 30   Progression   Progression Continue progressive overload as per policy without signs/symptoms or physical distress.   Resistance Training   Training Prescription Yes   Weight 4   Reps 10-15   Interval Training   Interval Training Yes   Equipment Treadmill   Comments 2 minX30 sec  intervals.Speed between 2.3/1% and 2.5/2%. Ranging 12-15 RPE. Total 20 min on TM.    Treadmill   MPH 2.5  interval training: see above comment   Grade 2   Minutes 20   Recumbant Bike   Level --   Watts --   Minutes --   NuStep   Level 4   Watts 40   Minutes 20   Recumbant Elliptical   Level --   Watts --   Minutes --   REL-XR   Level --   Watts --   Minutes --   T5 Nustep   Level --   Watts --   Minutes --  Biostep-RELP   Level 3   Watts 25      Nutrition:  Target Goals: Understanding of nutrition guidelines, daily intake of sodium <151m, cholesterol <2029m calories 30% from fat and 7% or less from saturated fats, daily to have 5 or more servings of fruits and vegetables.  Biometrics:     Pre Biometrics - 05/21/15 1452    Pre Biometrics   Height _0  (1.778 m)   Weight 177 lb 9.6 oz (80.559 kg)   Waist Circumference 39.5 inches   Hip Circumference 39.5 inches   Waist to Hip Ratio 1 %   BMI (Calculated) 25.5       Nutrition Therapy Plan and Nutrition Goals:     Nutrition Therapy & Goals - 06/03/15 0835    Nutrition Therapy   Diet Instructed on a heart healthy meal plan based on 2000 calories including Gonzales diet principles.   Drug/Food Interactions Statins/Certain Fruits   Protein (specify units) 8   Fiber 30 grams   Whole Grain Foods 3 servings   Saturated Fats 13 max. grams   Fruits and Vegetables 5 servings/day   Sodium 2000 grams   Personal Nutrition Goals   Personal Goal #1 Try some trans free margarines such as "I Can't Believe it's not butter" or Smart Balance   Personal Goal #2 Include a protein food with each meal to help reach goal of 8 oz. per day   Personal Goal #3 To use some of the Ms. Gonzales seasonings.   Intervention Plan   Intervention Prescribe, educate and counsel regarding individualized specific dietary modifications aiming towards targeted core components such as weight, hypertension, lipid management, diabetes, heart failure  and other comorbidities.;Nutrition handout(s) given to patient.   Expected Outcomes Short Term Goal: Understand basic principles of dietary content, such as calories, fat, sodium, cholesterol and nutrients.;Short Term Goal: A plan has been developed with personal nutrition goals set during dietitian appointment.;Long Term Goal: Adherence to prescribed nutrition plan.      Nutrition Discharge: Rate Your Plate Scores:     Nutrition Assessments - 05/31/15 0839    Rate Your Plate Scores   Pre Score 61   Pre Score % 68 %      Nutrition Goals Re-Evaluation:     Nutrition Goals Re-Evaluation      06/07/15 0921           Personal Goal #1 Re-Evaluation   Goal Progress Seen No       Comments Luke Gonzales he doesn't eat much margaine       Personal Goal #2 Re-Evaluation   Goal Progress Seen Yes       Comments "My appeptite is getting better but still now what it was .        Personal Goal #3 Re-Evaluation   Goal Progress Seen No          Psychosocial: Target Goals: Acknowledge presence or absence of depression, maximize coping skills, provide positive support system. Participant is able to verbalize types and ability to use techniques and skills needed for reducing stress and depression.  Initial Review & Psychosocial Screening:     Initial Psych Review & Screening - 05/21/15 1334    Family Dynamics   Good Support System? Yes   Barriers   Psychosocial barriers to participate in program There are no identifiable barriers or psychosocial needs.;The patient should benefit from training in stress management and relaxation.   Screening Interventions   Interventions Encouraged to exercise  Quality of Life Scores:     Quality of Life - 05/22/15 1348    Quality of Life Scores   Health/Function Pre 23.5 %   Socioeconomic Pre 23.33 %   Psych/Spiritual Pre 22.5 %   Family Pre 25.9 %   GLOBAL Pre 23.63 %      PHQ-9:     Recent Review Flowsheet Data    Depression screen  [facility] 2/9 05/21/2015   Decreased Interest 0   Down, Depressed, Hopeless 0   PHQ - 2 Score 0   Altered sleeping 0   Tired, decreased energy 1   Change in appetite 2    Feeling bad or failure about yourself  0   Trouble concentrating 0   Moving slowly or fidgety/restless 0   Suicidal thoughts 0   PHQ-9 Score 3   Difficult doing work/chores Not difficult at all      Psychosocial Evaluation and Intervention:     Psychosocial Evaluation - 05/27/15 0957    Psychosocial Evaluation & Interventions   Interventions Encouraged to exercise with the program and follow exercise prescription   Comments Counselor met with Luke Gonzales today for initial psychosocial evaluation.  He is a 75 year old who had open heart surgery in early March for aortic valve replacement.  He has a strong support system with a spouse of 73 years and several adult children who live close by.  Luke Gonzales is also actively involved in his local church.  He denies a history of depression or anxiety or current symptoms.  He reports to sleeping well and having a decreased appetite since the surgery.  Luke Gonzales states he is typically in a positive mood.  He has goals to increase his stamina and strength while in this program.        Psychosocial Re-Evaluation:   Vocational Rehabilitation: Provide vocational rehab assistance to qualifying candidates.   Vocational Rehab Evaluation & Intervention:     Vocational Rehab - 05/21/15 1323    Initial Vocational Rehab Evaluation & Intervention   Assessment shows need for Vocational Rehabilitation No      Education: Education Goals: Education classes will be provided on a weekly basis, covering required topics. Participant will state understanding/return demonstration of topics presented.  Learning Barriers/Preferences:     Learning Barriers/Preferences - 05/21/15 1323    Learning Barriers/Preferences   Learning Barriers Hearing   Learning Preferences Individual Instruction       Education Topics: General Nutrition Guidelines/Fats and Fiber: -Group instruction provided by verbal, written material, models and posters to present the general guidelines for heart healthy nutrition. Gives an explanation and review of dietary fats and fiber.   Controlling Sodium/Reading Food Labels: -Group verbal and written material supporting the discussion of sodium use in heart healthy nutrition. Review and explanation with models, verbal and written materials for utilization of the food label.   Exercise Physiology & Risk Factors: - Group verbal and written instruction with models to review the exercise physiology of the cardiovascular system and associated critical values. Details cardiovascular disease risk factors and the goals associated with each risk factor.   Aerobic Exercise & Resistance Training: - Gives group verbal and written discussion on the health impact of inactivity. On the components of aerobic and resistive training programs and the benefits of this training and how to safely progress through these programs.          Cardiac Rehab from 06/17/2015 in [facility] Cardiac and Pulmonary Rehab   Date  05/27/15  Educator  RS   Instruction Review Code  2- meets goals/outcomes      Flexibility, Balance, General Exercise Guidelines: - Provides group verbal and written instruction on the benefits of flexibility and balance training programs. Provides general exercise guidelines with specific guidelines to those with heart or lung disease. Demonstration and skill practice provided.      Cardiac Rehab from 06/17/2015 in [facility] Cardiac and Pulmonary Rehab   Date  05/29/15   Educator  bs   Instruction Review Code  2- meets goals/outcomes      Stress Management: - Provides group verbal and written instruction about the health risks of elevated stress, cause of high stress, and healthy ways to reduce stress.      Cardiac Rehab from 06/17/2015 in [facility] Cardiac and Pulmonary  Rehab   Date  06/05/15   Educator  [facility]   Instruction Review Code  2- meets goals/outcomes      Depression: - Provides group verbal and written instruction on the correlation between heart/lung disease and depressed mood, treatment options, and the stigmas associated with seeking treatment.   Anatomy & Physiology of the Heart: - Group verbal and written instruction and models provide basic cardiac anatomy and physiology, with the coronary electrical and arterial systems. Review of: AMI, Angina, Valve disease, Heart Failure, Cardiac Arrhythmia, Pacemakers, and the ICD.      Cardiac Rehab from 06/17/2015 in [facility] Cardiac and Pulmonary Rehab   Date  06/03/15   Educator  Sb   Instruction Review Code  2- meets goals/outcomes      Cardiac Procedures: - Group verbal and written instruction and models to describe the testing methods done to diagnose heart disease. Reviews the outcomes of the test results. Describes the treatment choices: Medical Management, Angioplasty, or Coronary Bypass Surgery.      Cardiac Rehab from 06/17/2015 in [facility] Cardiac and Pulmonary Rehab   Date  06/10/15   Educator  CE   Instruction Review Code  2- meets goals/outcomes      Cardiac Medications: - Group verbal and written instruction to review commonly prescribed medications for heart disease. Reviews the medication, class of the drug, and side effects. Includes the steps to properly store meds and maintain the prescription regimen.      Cardiac Rehab from 06/17/2015 in [facility] Cardiac and Pulmonary Rehab   Date  06/17/15   Educator  SB   Instruction Review Code  2- meets goals/outcomes      Go Sex-Intimacy & Heart Disease, Get SMART - Goal Setting: - Group verbal and written instruction through game format to discuss heart disease and the return to sexual intimacy. Provides group verbal and written material to discuss and apply goal setting through the application of the S.M.A.R.T. Method.      Cardiac Rehab from  06/17/2015 in [facility] Cardiac and Pulmonary Rehab   Date  06/10/15   Educator  CE   Instruction Review Code  2- meets goals/outcomes      Other Matters of the Heart: - Provides group verbal, written materials and models to describe Heart Failure, Angina, Valve Disease, and Diabetes in the realm of heart disease. Includes description of the disease process and treatment options available to the cardiac patient.      Cardiac Rehab from 06/17/2015 in [facility] Cardiac and Pulmonary Rehab   Date  06/03/15   Educator  SB   Instruction Review Code  2- meets goals/outcomes      Exercise & Equipment Safety: - Individual verbal  instruction and demonstration of equipment use and safety with use of the equipment.      Cardiac Rehab from 06/17/2015 in [facility] Cardiac and Pulmonary Rehab   Date  05/21/15   Educator  SB   Instruction Review Code  2- meets goals/outcomes      Infection Prevention: - Provides verbal and written material to individual with discussion of infection control including proper hand washing and proper equipment cleaning during exercise session.      Cardiac Rehab from 06/17/2015 in [facility] Cardiac and Pulmonary Rehab   Date  05/21/15   Educator  SB   Instruction Review Code  2- meets goals/outcomes      Falls Prevention: - Provides verbal and written material to individual with discussion of falls prevention and safety.      Cardiac Rehab from 06/17/2015 in [facility] Cardiac and Pulmonary Rehab   Date  05/21/15   Educator  SB   Instruction Review Code  2- meets goals/outcomes      Diabetes: - Individual verbal and written instruction to review signs/symptoms of diabetes, desired ranges of glucose level fasting, after meals and with exercise. Advice that pre and post exercise glucose checks will be done for 3 sessions at entry of program.    Knowledge Questionnaire Score:     Knowledge Questionnaire Score - 05/22/15 1348    Knowledge Questionnaire Score   Pre Score 21/28       Core Components/Risk Factors/Patient Goals at Admission:     Personal Goals and Risk Factors at Admission - 05/21/15 1334    Core Components/Risk Factors/Patient Goals on Admission   Sedentary Yes   Intervention Provide advice, education, support and counseling about physical activity/exercise needs.;Develop an individualized exercise prescription for aerobic and resistive training based on initial evaluation findings, risk stratification, comorbidities and participant's personal goals.   Expected Outcomes Achievement of increased cardiorespiratory fitness and enhanced flexibility, muscular endurance and strength shown through measurements of functional capacity and personal statement of participant.   Increase Strength and Stamina Yes   Intervention Provide advice, education, support and counseling about physical activity/exercise needs.;Develop an individualized exercise prescription for aerobic and resistive training based on initial evaluation findings, risk stratification, comorbidities and participant's personal goals.   Expected Outcomes Achievement of increased cardiorespiratory fitness and enhanced flexibility, muscular endurance and strength shown through measurements of functional capacity and personal statement of participant.   Lipids Yes   Intervention Provide education and support for participant on nutrition & aerobic/resistive exercise along with prescribed medications to achieve LDL <28m, HDL >480m   Expected Outcomes Short Term: Participant states understanding of desired cholesterol values and is compliant with medications prescribed. Participant is following exercise prescription and nutrition guidelines.;Long Term: Cholesterol controlled with medications as prescribed, with individualized exercise RX and with personalized nutrition plan. Value goals: LDL < 709mHDL > 40 mg.      Core Components/Risk Factors/Patient Goals Review:      Goals and Risk Factor Review       06/07/15 1003           Core Components/Risk Factors/Patient Goals Review   Personal Goals Review Weight Management/Obesity;Sedentary       Review Luke Gonzales 15 lbs with his last surgery and his appetite is not totally back. His weight lifting restriction is off so he will cont to us [place] 4 lb weights. He is doing more at home. Luke Gonzales he met with the Cardiac Rehab Registered Dietician and said he has already been eating  healthy.        Expected Outcomes Cont to exercise in Cardiac Rehab to compelte 36 sessions.           Core Components/Risk Factors/Patient Goals at Discharge (Final Review):      Goals and Risk Factor Review - 06/07/15 1003    Core Components/Risk Factors/Patient Goals Review   Personal Goals Review Weight Management/Obesity;Sedentary   Review Luke Gonzales lost 15 lbs with his last surgery and his appetite is not totally back. His weight lifting restriction is off so he will cont to [place] two 4 lb weights. He is doing more at home. Luke Gonzales said he met with the Cardiac Rehab Registered Dietician and said he has already been eating healthy.    Expected Outcomes Cont to exercise in Cardiac Rehab to compelte 36 sessions.       ITP Comments:     ITP Comments      05/21/15 1459 05/26/15 0947 06/07/15 0915 06/23/15 1246     ITP Comments medical review completed   Initial ITP  Continue with ITP 30 day review.  Continue with ITP  New to program Luke Gonzales said he met with the Cardiac Rehab Registered Dietician and she said his appetite may not come back for a couple of weeks still. He lost 15 lbs with his past surgery. Luke Gonzales "Luke Gonzales" said his wife cooks for him and he is not sure if she has tried Luke Gonzales yet. He said that he doesn't eat much margarine so he has not tried to use Luke Gonzales or other products yet but he has eaten farily healthy. Luke Gonzales said MD took him off his weight lifting Luke Gonzales but he still wanted to use two 4 lbs weiht today during Cardiac Rehab Resistive training  portion.  30 day review. Continue with ITP       Comments:

## 2015-06-24 ENCOUNTER — Encounter: Payer: Medicare Other | Admitting: *Deleted

## 2015-06-24 DIAGNOSIS — Z952 Presence of prosthetic heart valve: Secondary | ICD-10-CM

## 2015-06-24 NOTE — Progress Notes (Signed)
Daily Session Note  Patient Details  Name: TAYO MAUTE MRN: [id number] Date of Birth: Apr 13, 1940 Referring Provider:    Encounter Date: 06/24/2015  Check In:     Session Check In - 06/24/15 1358    Check-In   Location [facility]   Staff Present Nyoka Cowden, RN;Carroll Enterkin, RN, Moises Blood, BS, ACSM CEP, Exercise Physiologist   Supervising physician immediately available to respond to emergencies See telemetry face sheet for immediately available ER MD   Medication changes reported     No   Fall or balance concerns reported    No   Warm-up and Cool-down Performed on first and last piece of equipment   Resistance Training Performed Yes   VAD Patient? No   Pain Assessment   Currently in Pain? No/denies   Multiple Pain Sites No         Goals Met:  Independence with exercise equipment Exercise tolerated well No report of cardiac concerns or symptoms Strength training completed today  Goals Unmet:  Not Applicable  Comments: Patient completed exercise prescription and all exercise goals during rehab session. The exercise was tolerated well and the patient is progressing in the program.     Dr. Emily Filbert is Medical Director for [facility].

## 2015-06-26 ENCOUNTER — Encounter: Payer: Medicare Other | Admitting: *Deleted

## 2015-06-26 DIAGNOSIS — Z952 Presence of prosthetic heart valve: Secondary | ICD-10-CM | POA: Diagnosis not present

## 2015-06-26 NOTE — Progress Notes (Signed)
Daily Session Note  Patient Details  Name: Luke Gonzales MRN: [id number] Date of Birth: 1940/03/05 Referring Provider:    Encounter Date: 06/26/2015  Check In:     Session Check In - 06/26/15 0944    Check-In   Location [facility]   Staff Present Nyoka Cowden, RN;Betsy Rosello Luan Pulling, MA, ACSM RCEP, Exercise Physiologist;Amanda Oletta Darter, BA, ACSM CEP, Exercise Physiologist;Susanne Bice, RN, BSN, CCRP;Diane Joya Gaskins, RN, BSN   Supervising physician immediately available to respond to emergencies See telemetry face sheet for immediately available ER MD   Medication changes reported     No   Fall or balance concerns reported    No   Warm-up and Cool-down Performed on first and last piece of equipment   Resistance Training Performed Yes   VAD Patient? No   Pain Assessment   Currently in Pain? No/denies   Multiple Pain Sites No         Goals Met:  Independence with exercise equipment Exercise tolerated well No report of cardiac concerns or symptoms Strength training completed today  Goals Unmet:  Not Applicable  Comments: Pt able to follow exercise prescription today without complaint.  Will continue to monitor for progression.    Dr. Emily Filbert is Medical Director for [place] [facility].

## 2015-06-28 ENCOUNTER — Encounter: Payer: Medicare Other | Admitting: *Deleted

## 2015-06-28 DIAGNOSIS — Z952 Presence of prosthetic heart valve: Secondary | ICD-10-CM | POA: Diagnosis not present

## 2015-06-28 NOTE — Progress Notes (Signed)
Daily Session Note  Patient Details  Name: Luke Gonzales MRN: [id number] Date of Birth: January 27, 1941 Referring Provider:    Encounter Date: 06/28/2015  Check In:     Session Check In - 06/28/15 0937    Check-In   Location [facility]   Staff Present Heath Lark, RN, BSN, CCRP;Talaysha Freeberg, RN, Levie Heritage, MA, ACSM RCEP, Exercise Physiologist   Supervising physician immediately available to respond to emergencies See telemetry face sheet for immediately available ER MD   Medication changes reported     No   Fall or balance concerns reported    No   Warm-up and Cool-down Performed on first and last piece of equipment   Resistance Training Performed Yes   VAD Patient? No   Pain Assessment   Currently in Pain? No/denies         Goals Met:  Proper associated with RPD/PD & O2 Sat Exercise tolerated well  Goals Unmet:  Not Applicable  Comments:     Dr. Emily Filbert is Medical Director for [facility].

## 2015-07-03 ENCOUNTER — Encounter: Payer: Medicare Other | Admitting: *Deleted

## 2015-07-03 DIAGNOSIS — I480 Paroxysmal atrial fibrillation: Secondary | ICD-10-CM | POA: Diagnosis not present

## 2015-07-03 DIAGNOSIS — Q251 Coarctation of aorta: Secondary | ICD-10-CM | POA: Diagnosis not present

## 2015-07-03 DIAGNOSIS — Z952 Presence of prosthetic heart valve: Secondary | ICD-10-CM

## 2015-07-03 DIAGNOSIS — I10 Essential (primary) hypertension: Secondary | ICD-10-CM | POA: Diagnosis not present

## 2015-07-03 DIAGNOSIS — E782 Mixed hyperlipidemia: Secondary | ICD-10-CM | POA: Diagnosis not present

## 2015-07-03 NOTE — Progress Notes (Signed)
Daily Session Note  Patient Details  Name: Luke Gonzales MRN: [id number] Date of Birth: Feb 14, 1940 Referring Provider:    Encounter Date: 07/03/2015  Check In:     Session Check In - 07/03/15 0837    Check-In   Location [facility]   Staff Present Nyoka Cowden, RN;Amanda Sommer, BA, ACSM CEP, Exercise Physiologist;Jessica Luan Pulling, MA, ACSM RCEP, Exercise Physiologist;Diane Joya Gaskins, RN, BSN   Supervising physician immediately available to respond to emergencies See telemetry face sheet for immediately available ER MD   Medication changes reported     No   Fall or balance concerns reported    No   Warm-up and Cool-down Performed on first and last piece of equipment   Resistance Training Performed Yes   VAD Patient? No   Pain Assessment   Currently in Pain? No/denies   Multiple Pain Sites No           Exercise Prescription Changes - 07/02/15 1500    Exercise Review   Progression Yes   Response to Exercise   Blood Pressure (Admit) 122/70 mmHg   Blood Pressure (Exercise) 140/56 mmHg   Blood Pressure (Exit) 118/64 mmHg   Heart Rate (Admit) 59 bpm   Heart Rate (Exercise) 75 bpm   Heart Rate (Exit) 63 bpm   Perceived Dyspnea (Exercise) 12   Symptoms none   Duration Progress to 30 minutes of continuous aerobic without signs/symptoms of physical distress   Intensity THRR New  99-130   Progression   Progression Continue to progress workloads to maintain intensity without signs/symptoms of physical distress.   Average METs 3.98   Resistance Training   Training Prescription Yes   Weight 4   Reps 10-15   Interval Training   Interval Training No  Has not been doing intervals on treadmill   Treadmill   MPH 2.5   Grade 3   Minutes 15   NuStep   Level 6   Watts 70   Minutes 20   REL-XR   Level 6   Watts 69   Minutes 20   Biostep-RELP   Level 7   Watts 50      Goals Met:  Independence with exercise equipment Exercise tolerated well No  report of cardiac concerns or symptoms Strength training completed today  Goals Unmet:  Not Applicable  Comments: Pt able to follow exercise prescription today without complaint.  Will continue to monitor for progression.  Reviewed individual exercise prescription with patient.  Changes were noted and patient was able to perform at new work load without signs or symptoms.   Dr. Emily Filbert is Medical Director for [facility].

## 2015-07-05 ENCOUNTER — Encounter: Payer: Medicare Other | Attending: Internal Medicine | Admitting: *Deleted

## 2015-07-05 DIAGNOSIS — Z952 Presence of prosthetic heart valve: Secondary | ICD-10-CM

## 2015-07-05 NOTE — Progress Notes (Signed)
Daily Session Note  Patient Details  Name: PUNEET MASONER MRN: [id number] Date of Birth: [birth year] Referring Provider:    Encounter Date: 07/05/2015  Check In:     Session Check In - 07/05/15 0919    Check-In   Staff Present Heath Lark, RN, BSN, CCRP;Carroll Enterkin, RN, BSN;Stacey Blanch Media, RRT, RCP, Respiratory Lennie Hummer, MA, ACSM RCEP, Exercise Physiologist   Supervising physician immediately available to respond to emergencies See telemetry face sheet for immediately available ER MD   Medication changes reported     No   Fall or balance concerns reported    No   Warm-up and Cool-down Performed on first and last piece of equipment   VAD Patient? No   Pain Assessment   Currently in Pain? No/denies         Goals Met:  Independence with exercise equipment Exercise tolerated well No report of cardiac concerns or symptoms Strength training completed today  Goals Unmet:  Not Applicable  Comments: Doing well with exercise prescription progression.    Dr. Emily Filbert is Medical Director for [facility].

## 2015-07-08 ENCOUNTER — Encounter: Payer: Medicare Other | Admitting: *Deleted

## 2015-07-08 DIAGNOSIS — Z952 Presence of prosthetic heart valve: Secondary | ICD-10-CM | POA: Diagnosis not present

## 2015-07-08 NOTE — Progress Notes (Signed)
Daily Session Note  Patient Details  Name: Luke Gonzales MRN: [id number] Date of Birth: [birth year] Referring Provider:    Encounter Date: 07/08/2015  Check In:     Session Check In - 07/08/15 0848    Check-In   Location [facility]   Staff Present Alberteen Sam, MA, ACSM RCEP, Exercise Physiologist;Susanne Bice, RN, BSN, Laveda Norman, BS, ACSM CEP, Exercise Physiologist   Supervising physician immediately available to respond to emergencies See telemetry face sheet for immediately available ER MD   Medication changes reported     No   Fall or balance concerns reported    No   Warm-up and Cool-down Performed on first and last piece of equipment   Resistance Training Performed No   VAD Patient? No   Pain Assessment   Currently in Pain? No/denies   Multiple Pain Sites No         Goals Met:  Independence with exercise equipment Exercise tolerated well No report of cardiac concerns or symptoms  Goals Unmet:  Not Applicable  Comments: Pt able to follow exercise prescription today without complaint.  Will continue to monitor for progression.    Dr. Emily Filbert is Medical Director for [facility].

## 2015-07-10 DIAGNOSIS — Z952 Presence of prosthetic heart valve: Secondary | ICD-10-CM | POA: Diagnosis not present

## 2015-07-10 NOTE — Progress Notes (Addendum)
Daily Session Note  Patient Details  Name: Luke Gonzales MRN: [id number] Date of Birth: Oct 21, 1940 Referring Provider:    Encounter Date: 07/10/2015  Check In:     Session Check In - 07/10/15 0908    Check-In   Location [facility]   Staff Present Gerlene Burdock, RN, BSN;Jessica Luan Pulling, MA, ACSM RCEP, Exercise Physiologist;Amanda Oletta Darter, BA, ACSM CEP, Exercise Physiologist   Supervising physician immediately available to respond to emergencies See telemetry face sheet for immediately available ER MD   Medication changes reported     No   Fall or balance concerns reported    No   Warm-up and Cool-down Performed on first and last piece of equipment   Resistance Training Performed No   VAD Patient? No   Pain Assessment   Currently in Pain? No/denies   Multiple Pain Sites No         Goals Met:  Independence with exercise equipment Exercise tolerated well No report of cardiac concerns or symptoms Strength training completed today Completed post 6 min walk test  Goals Unmet:  Not Applicable  Comments:  Pt able to follow exercise prescription today without complaint.  Will continue to monitor for progression.      6 Minute Walk      05/21/15 1450 05/21/15 1500 07/10/15 0943   6 Minute Walk   Phase Initial  Discharge   Distance 1133 feet  1410 feet   Distance % Change   24 %   Walk Time 6 minutes  6 minutes   # of Rest Breaks   0   MPH 2.1  2.67   METS   2.67   RPE 12  11   VO2 Peak   9.37   Symptoms   No   Resting HR 67 bpm  65 bpm   Resting BP 138/74 mmHg  124/64 mmHg   Max Ex. HR 92 bpm  70 bpm   Max Ex. BP 162/78 mmHg  126/60 mmHg   2 Minute Post BP  130/66 mmHg    Interval Oxygen   Interval Oxygen? Yes     Baseline Oxygen Saturation % 97 %     6 Minute Oxygen Saturation % 95 %         Dr. Emily Filbert is Medical Director for [place] and [facility].

## 2015-07-11 DIAGNOSIS — J3801 Paralysis of vocal cords and larynx, unilateral: Secondary | ICD-10-CM | POA: Diagnosis not present

## 2015-07-11 DIAGNOSIS — R490 Dysphonia: Secondary | ICD-10-CM | POA: Diagnosis not present

## 2015-07-12 ENCOUNTER — Encounter: Payer: Medicare Other | Admitting: *Deleted

## 2015-07-12 DIAGNOSIS — Z952 Presence of prosthetic heart valve: Secondary | ICD-10-CM | POA: Diagnosis not present

## 2015-07-12 NOTE — Progress Notes (Signed)
Daily Session Note  Patient Details  Name: Luke Gonzales MRN: [id number] Date of Birth: Sep 13, 1940 Referring Provider:    Encounter Date: 07/12/2015  Check In:     Session Check In - 07/12/15 0921    Check-In   Location [facility]   Staff Present Heath Lark, RN, BSN, CCRP;Rector Devonshire, RN, Levie Heritage, MA, ACSM RCEP, Exercise Physiologist   Medication changes reported     No   Fall or balance concerns reported    No   Warm-up and Cool-down Performed on first and last piece of equipment   Resistance Training Performed Yes   VAD Patient? No   Pain Assessment   Currently in Pain? No/denies         Goals Met:  Proper associated with RPD/PD & O2 Sat Exercise tolerated well  Goals Unmet:  Not Applicable  Comments:    Dr. Emily Filbert is Medical Director for [facility].

## 2015-07-12 NOTE — Progress Notes (Signed)
Cardiac Individual Treatment Plan  Patient Details  Name: Luke Gonzales MRN: [id number] Date of Birth: November 24, 1940 Referring Provider:    Initial Encounter Date:       Cardiac Rehab from 05/21/2015 in [facility] Cardiac and Pulmonary Rehab   Date  05/21/15      Visit Diagnosis: H/O aortic valve replacement  Patient's Home Medications on Admission:  Current outpatient prescriptions:  .  allopurinol (ZYLOPRIM) 300 MG tablet, Take 300 mg by mouth daily., Disp: , Rfl:  .  amiodarone (PACERONE) 200 MG tablet, Take 200 mg by mouth 2 (two) times daily. Reported on 06/07/2015, Disp: , Rfl:  .  aspirin 81 MG chewable tablet, Chew 81 mg by mouth at bedtime. Reported on 05/21/2015, Disp: , Rfl:  .  atorvastatin (LIPITOR) 40 MG tablet, Take 40 mg by mouth every evening. , Disp: , Rfl:  .  cetirizine (ZYRTEC) 10 MG tablet, Take 10 mg by mouth daily., Disp: , Rfl:  .  metoprolol (LOPRESSOR) 50 MG tablet, Take 50 mg by mouth 2 (two) times daily., Disp: , Rfl:  .  niacin (NIASPAN) 1000 MG CR tablet, Take 1,000 mg by mouth at bedtime., Disp: , Rfl:  .  oxyCODONE (OXY IR/ROXICODONE) 5 MG immediate release tablet, Take 5 mg by mouth every 6 (six) hours as needed for severe pain., Disp: , Rfl:  .  warfarin (COUMADIN) 5 MG tablet, Take 5 mg by mouth every evening. Reported on 05/21/2015, Disp: , Rfl:   Past Medical History: Past Medical History  Diagnosis Date  . Hypertension   . Shortness of breath dyspnea   . Coronary artery disease   . Aortic stenosis s/p bicupspid valve  . BPH (benign prostatic hypertrophy)   . Gout   . Hyperlipemia     Tobacco Use: History  Smoking status  . Former Smoker -- 15 years  . Types: Cigarettes  Smokeless tobacco  . Not on file    Labs: Recent Review Flowsheet Data    There is no flowsheet data to display.       Exercise Target Goals:    Exercise Program Goal: Individual exercise prescription set with THRR, safety & activity barriers. Participant  demonstrates ability to understand and report RPE using BORG scale, to self-measure pulse accurately, and to acknowledge the importance of the exercise prescription.  Exercise Prescription Goal: Starting with aerobic activity 30 plus minutes a day, 3 days per week for initial exercise prescription. Provide home exercise prescription and guidelines that participant acknowledges understanding prior to discharge.  Activity Barriers & Risk Stratification:     Activity Barriers & Cardiac Risk Stratification - 05/21/15 1327    Activity Barriers & Cardiac Risk Stratification   Activity Barriers Joint Problems  Right shoulder arthritis.  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 HR 92 bpm  70 bpm   Max Ex. BP 162/78 mmHg  126/60 mmHg   2 Minute Post BP  130/66 mmHg    Interval Oxygen   Interval Oxygen? Yes     Baseline Oxygen Saturation % 97 %     6 Minute Oxygen Saturation % 95 %        Initial Exercise Prescription:     Initial Exercise Prescription - 05/21/15 1400    Date of Initial Exercise RX and Referring Provider   Date 05/21/15   Treadmill   MPH 2   Grade 0   Minutes 15   Recumbant Bike   Level 2   Watts 20   Minutes 15   NuStep   Level 2   Watts 40   Minutes 15   Recumbant Elliptical   Level 2   Watts 20   Minutes 15   REL-XR   Level 2   Watts 50   Minutes 15   T5 Nustep   Level 1   Watts 50   Minutes 15   Biostep-RELP   Level 1   Watts 15   Prescription Details   Frequency (times per week) 3   Duration Progress to 50  minutes of aerobic without signs/symptoms of physical distress   Intensity   THRR REST +  30   Ratings of Perceived Exertion 11-15   Perceived Dyspnea 0-4   Progression   Progression Continue to progress workloads to maintain intensity without signs/symptoms of physical distress.   Resistance Training   Training Prescription Yes   Weight 2   Reps 10-15      Perform Capillary Blood Glucose checks as needed.  Exercise Prescription Changes:     Exercise Prescription Changes      05/21/15 1400 06/12/15 0600 07/02/15 1500       Exercise Review   Progression  Yes Yes     Response to Exercise   Blood Pressure (Admit) 138/74 mmHg 148/80 mmHg 122/70 mmHg     Blood Pressure (Exercise) 162/78 mmHg 142/82 mmHg 140/56 mmHg     Blood Pressure (Exit) 130/66 mmHg 126/64 mmHg 118/64 mmHg     Heart Rate (Admit) 67 bpm 63 bpm 59 bpm     Heart Rate (Exercise) 92 bpm 78 bpm 75 bpm     Heart Rate (Exit) 73 bpm 68 bpm 63 bpm     Perceived Dyspnea (Exercise) _0 Symptoms  none none     Duration  Progress to 45 minutes of aerobic exercise without signs/symptoms of physical distress Progress to 30 minutes of continuous aerobic without signs/symptoms of physical distress     Intensity  Rest + 30 THRR New  99-130     Progression   Progression  Continue progressive overload as per policy without signs/symptoms or physical distress. Continue to progress workloads to maintain intensity without signs/symptoms of physical distress.     Average METs   3.98     Resistance Training   Training Prescription  Yes Yes     Weight  4 4     Reps  10-15 10-15     Interval Training   Interval Training  Yes No  Has not been doing intervals on treadmill     Equipment  Treadmill      Comments  2 minX30 sec intervals.Speed between 2.3/1% and 2.5/2%. Ranging 12-15 RPE. Total 20 min on TM.  Treadmill   MPH  2.5  interval training: see above comment 2.5     Grade  2 3     Minutes  20 15     Recumbant  Bike   Level  --      Watts  --      Minutes  --      NuStep   Level  4 6     Watts  40 70     Minutes  20 20     Recumbant Elliptical   Level  --      Watts  --      Minutes  --      REL-XR   Level  -- 6     Watts  -- 69     Minutes  -- 20     T5 Nustep   Level  --      Watts  --      Minutes  --      Biostep-RELP   Level  3 7     Watts  25 50        Exercise Comments:     Exercise Comments      05/27/15 0857 06/10/15 1206 06/12/15 0626 07/02/15 1525     Exercise Comments Today was the patient's first day of class. The patient's initial exercise prescription (based on the 6 min walk evaluation) was reviewed with the patient. Gretta Cool stated that he is starting to feel stronger since starting cardiac rehab class. He stated that his doctor had told him 8 weeks after surgery he could start doing more. This 8 week post surgery time frame starts tomorrow 06/11/15. He said that he feels like he wants to start trying more daily activities and it was suggested that he try one new activitiy at a time and slowly progress as tolerated. 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  Reviewed individualized exercise prescription and made increases per departmental policy. Exercise increases were discussed with the patient and they were able to perform the new work loads without issue (no signs or symptoms).  The recommendations and benefits of interval training were reviewed with the patient.  Gretta Cool is making good progress with exercise.  He has not been doing the intervals on treadmill, will discuss with him at next session.  Pt will be encouraged to continue to increase workloads.       Discharge Exercise Prescription (Final Exercise Prescription Changes):     Exercise Prescription Changes -  07/02/15 1500    Exercise Review   Progression Yes   Response to Exercise   Blood Pressure (Admit) 122/70 mmHg   Blood Pressure (Exercise) 140/56 mmHg   Blood Pressure (Exit) 118/64 mmHg   Heart Rate (Admit) 59 bpm   Heart Rate (Exercise) 75 bpm   Heart Rate (Exit) 63 bpm   Perceived Dyspnea (Exercise) 12   Symptoms none   Duration Progress to 30 minutes of continuous aerobic without signs/symptoms of physical distress   Intensity THRR New  99-130   Progression   Progression Continue to progress workloads to maintain intensity without signs/symptoms of physical distress.   Average METs 3.98   Resistance Training   Training Prescription Yes   Weight 4   Reps 10-15   Interval Training  Interval Training No  Has not been doing intervals on treadmill   Treadmill   MPH 2.5   Grade 3   Minutes 15   NuStep   Level 6   Watts 70   Minutes 20   REL-XR   Level 6   Watts 69   Minutes 20   Biostep-RELP   Level 7   Watts 50      Nutrition:  Target Goals: Understanding of nutrition guidelines, daily intake of sodium <1583m, cholesterol <2068m calories 30% from fat and 7% or less from saturated fats, daily to have 5 or more servings of fruits and vegetables.  Biometrics:     Pre Biometrics - 05/21/15 1452    Pre Biometrics   Height _0  (1.778 m)   Weight 177 lb 9.6 oz (80.559 kg)   Waist Circumference 39.5 inches   Hip Circumference 39.5 inches   Waist to Hip Ratio 1 %   BMI (Calculated) 25.5       Nutrition Therapy Plan and Nutrition Goals:     Nutrition Therapy & Goals - 06/03/15 0835    Nutrition Therapy   Diet Instructed on a heart healthy meal plan based on 2000 calories including DASH diet principles.   Drug/Food Interactions Statins/Certain Fruits   Protein (specify units) 8   Fiber 30 grams   Whole Grain Foods 3 servings   Saturated Fats 13 max. grams   Fruits and Vegetables 5 servings/day   Sodium 2000 grams   Personal Nutrition Goals    Personal Goal #1 Try some trans free margarines such as "I Can't Believe it's not butter" or Smart Balance   Personal Goal #2 Include a protein food with each meal to help reach goal of 8 oz. per day   Personal Goal #3 To use some of the Ms. DASH seasonings.   Intervention Plan   Intervention Prescribe, educate and counsel regarding individualized specific dietary modifications aiming towards targeted core components such as weight, hypertension, lipid management, diabetes, heart failure and other comorbidities.;Nutrition handout(s) given to patient.   Expected Outcomes Short Term Goal: Understand basic principles of dietary content, such as calories, fat, sodium, cholesterol and nutrients.;Short Term Goal: A plan has been developed with personal nutrition goals set during dietitian appointment.;Long Term Goal: Adherence to prescribed nutrition plan.      Nutrition Discharge: Rate Your Plate Scores:     Nutrition Assessments - 07/08/15 1200    Rate Your Plate Scores   Pre Score 61   Pre Score % 68 %   Post Score 57   Post Score % 61 %   % Change -7 %      Nutrition Goals Re-Evaluation:     Nutrition Goals Re-Evaluation      06/07/15 0921           Personal Goal #1 Re-Evaluation   Goal Progress Seen No       Comments JaSavasaid he doesn't eat much margaine       Personal Goal #2 Re-Evaluation   Goal Progress Seen Yes       Comments "My appeptite is getting better but still now what it was .        Personal Goal #3 Re-Evaluation   Goal Progress Seen No          Psychosocial: Target Goals: Acknowledge presence or absence of depression, maximize coping skills, provide positive support system. Participant is able to verbalize types and ability to use techniques and skills needed  for reducing stress and depression.  Initial Review & Psychosocial Screening:     Initial Psych Review & Screening - 05/21/15 1334    Family Dynamics   Good Support System? Yes   Barriers    Psychosocial barriers to participate in program There are no identifiable barriers or psychosocial needs.;The patient should benefit from training in stress management and relaxation.   Screening Interventions   Interventions Encouraged to exercise      Quality of Life Scores:     Quality of Life - 07/08/15 1201    Quality of Life Scores   Health/Function Pre 23.5 %   Health/Function Post 24.6 %   Health/Function % Change 4.68 %   Socioeconomic Pre 23.33 %   Socioeconomic Post 24.43 %   Socioeconomic % Change  4.71 %   Psych/Spiritual Pre 22.5 %   Psych/Spiritual Post 25.07 %   Psych/Spiritual % Change 11.42 %   Family Pre 25.9 %   Family Post 22.8 %   Family % Change -11.97 %   GLOBAL Pre 23.63 %   GLOBAL Post 24.4 %   GLOBAL % Change 3.26 %      PHQ-9:     Recent Review Flowsheet Data    Depression screen [facility] 2/9 05/21/2015   Decreased Interest 0   Down, Depressed, Hopeless 0   PHQ - 2 Score 0   Altered sleeping 0   Tired, decreased energy 1   Change in appetite 2    Feeling bad or failure about yourself  0   Trouble concentrating 0   Moving slowly or fidgety/restless 0   Suicidal thoughts 0   PHQ-9 Score 3   Difficult doing work/chores Not difficult at all      Psychosocial Evaluation and Intervention:     Psychosocial Evaluation - 05/27/15 0957    Psychosocial Evaluation & Interventions   Interventions Encouraged to exercise with the program and follow exercise prescription   Comments Counselor met with Mr. Mccubbins today for initial psychosocial evaluation.  He is a 75 year old who had open heart surgery in early March for aortic valve replacement.  He has a strong support system with a spouse of 74 years and several adult children who live close by.  Mr. Tompson is also actively involved in his local church.  He denies a history of depression or anxiety or current symptoms.  He reports to sleeping well and having a decreased appetite since the surgery.  Mr.  Poth states he is typically in a positive mood.  He has goals to increase his stamina and strength while in this program.        Psychosocial Re-Evaluation:     Psychosocial Re-Evaluation      07/12/15 0933           Psychosocial Re-Evaluation   Interventions Encouraged to attend Cardiac Rehabilitation for the exercise       Comments Izaah said he saw that throat doctor yesterday who told him that his voice quality may return but may not. I  suggested his ask his MD if Speech Therapy might help. 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 I checked na dhe does have Silver Sneakers so I showed him a Silver Sneakers exercise class that was going on this am and the [employer]. He said his wife probably has Silver Sneakers too and he will let her know.           Vocational Rehabilitation:  Provide vocational rehab assistance to qualifying candidates.   Vocational Rehab Evaluation & Intervention:     Vocational Rehab - 05/21/15 1323    Initial Vocational Rehab Evaluation & Intervention   Assessment shows need for Vocational Rehabilitation No      Education: Education Goals: Education classes will be provided on a weekly basis, covering required topics. Participant will state understanding/return demonstration of topics presented.  Learning Barriers/Preferences:     Learning Barriers/Preferences - 05/21/15 1323    Learning Barriers/Preferences   Learning Barriers Hearing   Learning Preferences Individual Instruction      Education Topics: General Nutrition Guidelines/Fats and Fiber: -Group instruction provided by verbal, written material, models and posters to present the general guidelines for heart healthy nutrition. Gives an explanation and review of dietary fats and fiber.          Cardiac Rehab from 07/08/2015 in [facility] Cardiac and Pulmonary Rehab   Date  07/08/15   Educator  Jaclyn Shaggy   Instruction Review Code  2- meets goals/outcomes       Controlling Sodium/Reading Food Labels: -Group verbal and written material supporting the discussion of sodium use in heart healthy nutrition. Review and explanation with models, verbal and written materials for utilization of the food label.   Exercise Physiology & Risk Factors: - Group verbal and written instruction with models to review the exercise physiology of the cardiovascular system and associated critical values. Details cardiovascular disease risk factors and the goals associated with each risk factor.   Aerobic Exercise & Resistance Training: - Gives group verbal and written discussion on the health impact of inactivity. On the components of aerobic and resistive training programs and the benefits of this training and how to safely progress through these programs.      Cardiac Rehab from 07/08/2015 in [facility] Cardiac and Pulmonary Rehab   Date  05/27/15   Educator  RS   Instruction Review Code  2- meets goals/outcomes      Flexibility, Balance, General Exercise Guidelines: - Provides group verbal and written instruction on the benefits of flexibility and balance training programs. Provides general exercise guidelines with specific guidelines to those with heart or lung disease. Demonstration and skill practice provided.      Cardiac Rehab from 07/08/2015 in [facility] Cardiac and Pulmonary Rehab   Date  05/29/15   Educator  bs   Instruction Review Code  2- meets goals/outcomes      Stress Management: - Provides group verbal and written instruction about the health risks of elevated stress, cause of high stress, and healthy ways to reduce stress.      Cardiac Rehab from 07/08/2015 in [facility] Cardiac and Pulmonary Rehab   Date  06/05/15   Educator  [facility]   Instruction Review Code  2- meets goals/outcomes      Depression: - Provides group verbal and written instruction on the correlation between heart/lung disease and depressed mood, treatment options, and the stigmas associated with  seeking treatment.      Cardiac Rehab from 07/08/2015 in [facility] Cardiac and Pulmonary Rehab   Date  07/03/15   Educator  [facility]   Instruction Review Code  2- meets goals/outcomes      Anatomy & Physiology of the Heart: - Group verbal and written instruction and models provide basic cardiac anatomy and physiology, with the coronary electrical and arterial systems. Review of: AMI, Angina, Valve disease, Heart Failure, Cardiac Arrhythmia, Pacemakers, and the ICD.      Cardiac Rehab from 07/08/2015 in  [facility] Cardiac and Pulmonary Rehab   Date  06/03/15   Educator  Sb   Instruction Review Code  2- meets goals/outcomes      Cardiac Procedures: - Group verbal and written instruction and models to describe the testing methods done to diagnose heart disease. Reviews the outcomes of the test results. Describes the treatment choices: Medical Management, Angioplasty, or Coronary Bypass Surgery.      Cardiac Rehab from 07/08/2015 in [facility] Cardiac and Pulmonary Rehab   Date  06/10/15   Educator  CE   Instruction Review Code  2- meets goals/outcomes      Cardiac Medications: - Group verbal and written instruction to review commonly prescribed medications for heart disease. Reviews the medication, class of the drug, and side effects. Includes the steps to properly store meds and maintain the prescription regimen.      Cardiac Rehab from 07/08/2015 in [facility] Cardiac and Pulmonary Rehab   Date  06/17/15   Educator  SB   Instruction Review Code  2- meets goals/outcomes      Go Sex-Intimacy & Heart Disease, Get SMART - Goal Setting: - Group verbal and written instruction through game format to discuss heart disease and the return to sexual intimacy. Provides group verbal and written material to discuss and apply goal setting through the application of the S.M.A.R.T. Method.      Cardiac Rehab from 07/08/2015 in [facility] Cardiac and Pulmonary Rehab   Date  06/10/15   Educator  CE   Instruction Review Code  2- meets  goals/outcomes      Other Matters of the Heart: - Provides group verbal, written materials and models to describe Heart Failure, Angina, Valve Disease, and Diabetes in the realm of heart disease. Includes description of the disease process and treatment options available to the cardiac patient.      Cardiac Rehab from 07/08/2015 in [facility] Cardiac and Pulmonary Rehab   Date  06/03/15   Educator  SB   Instruction Review Code  2- meets goals/outcomes      Exercise & Equipment Safety: - Individual verbal instruction and demonstration of equipment use and safety with use of the equipment.      Cardiac Rehab from 07/08/2015 in [facility] Cardiac and Pulmonary Rehab   Date  05/21/15   Educator  SB   Instruction Review Code  2- meets goals/outcomes      Infection Prevention: - Provides verbal and written material to individual with discussion of infection control including proper hand washing and proper equipment cleaning during exercise session.      Cardiac Rehab from 07/08/2015 in [facility] Cardiac and Pulmonary Rehab   Date  05/21/15   Educator  SB   Instruction Review Code  2- meets goals/outcomes      Falls Prevention: - Provides verbal and written material to individual with discussion of falls prevention and safety.      Cardiac Rehab from 07/08/2015 in [facility] Cardiac and Pulmonary Rehab   Date  05/21/15   Educator  SB   Instruction Review Code  2- meets goals/outcomes      Diabetes: - Individual verbal and written instruction to review signs/symptoms of diabetes, desired ranges of glucose level fasting, after meals and with exercise. Advice that pre and post exercise glucose checks will be done for 3 sessions at entry of program.    Knowledge Questionnaire Score:     Knowledge Questionnaire Score - 07/08/15 1201    Knowledge Questionnaire Score   Pre Score 21/28  Post Score 24/28      Core Components/Risk Factors/Patient Goals at Admission:     Personal Goals and Risk Factors at  Admission - 05/21/15 1334    Core Components/Risk Factors/Patient Goals on Admission   Sedentary Yes   Intervention Provide advice, education, support and counseling about physical activity/exercise needs.;Develop an individualized exercise prescription for aerobic and resistive training based on initial evaluation findings, risk stratification, comorbidities and participant's personal goals.   Expected Outcomes Achievement of increased cardiorespiratory fitness and enhanced flexibility, muscular endurance and strength shown through measurements of functional capacity and personal statement of participant.   Increase Strength and Stamina Yes   Intervention Provide advice, education, support and counseling about physical activity/exercise needs.;Develop an individualized exercise prescription for aerobic and resistive training based on initial evaluation findings, risk stratification, comorbidities and participant's personal goals.   Expected Outcomes Achievement of increased cardiorespiratory fitness and enhanced flexibility, muscular endurance and strength shown through measurements of functional capacity and personal statement of participant.   Lipids Yes   Intervention Provide education and support for participant on nutrition & aerobic/resistive exercise along with prescribed medications to achieve LDL <14m, HDL >478m   Expected Outcomes Short Term: Participant states understanding of desired cholesterol values and is compliant with medications prescribed. Participant is following exercise prescription and nutrition guidelines.;Long Term: Cholesterol controlled with medications as prescribed, with individualized exercise RX and with personalized nutrition plan. Value goals: LDL < 7069mHDL > 40 mg.      Core Components/Risk Factors/Patient Goals Review:      Goals and Risk Factor Review      06/07/15 1003 07/12/15 0926         Core Components/Risk Factors/Patient Goals Review   Personal  Goals Review Weight Management/Obesity;Sedentary Sedentary;Weight Management/Obesity      Review JamAnshulst 15 lbs with his last surgery and his appetite is not totally back. His weight lifting restriction is off so he will cont to us [place] 4 lb weights. He is doing more at home. JamWilburnid he met with the Cardiac Rehab Registered Dietician and said he has already been eating healthy.  JamNavyid he saw that throat doctor yesterday who told him that his voice quality may return but may not. I  suggested his ask his MD if Speech Therapy might help. 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 I checked na dhe does have Silver Sneakers so I showed him a Silver Sneakers exercise class that was going on this am and the [employer]. He said his wife probably has Silver Sneakers too and he will let her know.       Expected Outcomes Cont to exercise in Cardiac Rehab to compelte 36 sessions.           Core Components/Risk Factors/Patient Goals at Discharge (Final Review):      Goals and Risk Factor Review - 07/12/15 0926    Core Components/Risk Factors/Patient Goals Review   Personal Goals Review Sedentary;Weight Management/Obesity   Review JamJorelid he saw that throat doctor yesterday who told him that his voice quality may return but may not. I  suggested his ask his MD if Speech Therapy might help. 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 He is almost done with Cardiac Rehab. I checked na dhe does have Silver Sneakers so I showed him a Silver Sneakers exercise class that was going on this am and the [employer]. He said his wife probably has Silver Sneakers too  and he will let her know.       ITP Comments:     ITP Comments      05/21/15 1459 05/26/15 0947 06/07/15 0915 06/23/15 1246     ITP Comments medical review completed   Initial ITP  Continue with ITP 30 day review.  Continue with ITP  New to program Jaquae said he met with the Cardiac Rehab Registered Dietician and she said  his appetite may not come back for a couple of weeks still. He lost 15 lbs with his past surgery. Arlyn "Gretta Cool" said his wife cooks for him and he is not sure if she has tried Mrs. Dash yet. He said that he doesn't eat much margarine so he has not tried to use Promise or other products yet but he has eaten farily healthy. Woody said MD took him off his weight lifting resistriction but he still wanted to use two 4 lbs weiht today during Cardiac Rehab Resistive training portion.  30 day review. Continue with ITP       Comments:

## 2015-07-15 ENCOUNTER — Encounter: Payer: Medicare Other | Admitting: *Deleted

## 2015-07-15 DIAGNOSIS — Z952 Presence of prosthetic heart valve: Secondary | ICD-10-CM | POA: Diagnosis not present

## 2015-07-15 NOTE — Progress Notes (Signed)
Daily Session Note  Patient Details  Name: Luke Gonzales MRN: [id number] Date of Birth: 11/12/1940 Referring Provider:    Encounter Date: 07/15/2015  Check In:     Session Check In - 07/15/15 0852    Check-In   Location [facility]   Staff Present Alberteen Sam, MA, ACSM RCEP, Exercise Physiologist;Susanne Bice, RN, BSN, Laveda Norman, BS, ACSM CEP, Exercise Physiologist;Other   Supervising physician immediately available to respond to emergencies See telemetry face sheet for immediately available ER MD   Medication changes reported     No   Fall or balance concerns reported    No   Warm-up and Cool-down Performed on first and last piece of equipment   Resistance Training Performed No   VAD Patient? No   Pain Assessment   Currently in Pain? No/denies   Multiple Pain Sites No         Goals Met:  Independence with exercise equipment Exercise tolerated well No report of cardiac concerns or symptoms  Goals Unmet:  Not Applicable  Comments: Pt able to follow exercise prescription today without complaint.  Will continue to monitor for progression.    Dr. Emily Filbert is Medical Director for [place] and [facility].

## 2015-07-17 DIAGNOSIS — Z952 Presence of prosthetic heart valve: Secondary | ICD-10-CM

## 2015-07-17 NOTE — Progress Notes (Signed)
Cardiac Individual Treatment Plan  Patient Details  Name: Luke Gonzales MRN: [id number] Date of Birth: November 24, 1940 Referring Provider:    Initial Encounter Date:       Cardiac Rehab from 05/21/2015 in [facility] Cardiac and Pulmonary Rehab   Date  05/21/15      Visit Diagnosis: H/O aortic valve replacement  Patient's Home Medications on Admission:  Current outpatient prescriptions:  .  allopurinol (ZYLOPRIM) 300 MG tablet, Take 300 mg by mouth daily., Disp: , Rfl:  .  amiodarone (PACERONE) 200 MG tablet, Take 200 mg by mouth 2 (two) times daily. Reported on 06/07/2015, Disp: , Rfl:  .  aspirin 81 MG chewable tablet, Chew 81 mg by mouth at bedtime. Reported on 05/21/2015, Disp: , Rfl:  .  atorvastatin (LIPITOR) 40 MG tablet, Take 40 mg by mouth every evening. , Disp: , Rfl:  .  cetirizine (ZYRTEC) 10 MG tablet, Take 10 mg by mouth daily., Disp: , Rfl:  .  metoprolol (LOPRESSOR) 50 MG tablet, Take 50 mg by mouth 2 (two) times daily., Disp: , Rfl:  .  niacin (NIASPAN) 1000 MG CR tablet, Take 1,000 mg by mouth at bedtime., Disp: , Rfl:  .  oxyCODONE (OXY IR/ROXICODONE) 5 MG immediate release tablet, Take 5 mg by mouth every 6 (six) hours as needed for severe pain., Disp: , Rfl:  .  warfarin (COUMADIN) 5 MG tablet, Take 5 mg by mouth every evening. Reported on 05/21/2015, Disp: , Rfl:   Past Medical History: Past Medical History  Diagnosis Date  . Hypertension   . Shortness of breath dyspnea   . Coronary artery disease   . Aortic stenosis s/p bicupspid valve  . BPH (benign prostatic hypertrophy)   . Gout   . Hyperlipemia     Tobacco Use: History  Smoking status  . Former Smoker -- 15 years  . Types: Cigarettes  Smokeless tobacco  . Not on file    Labs: Recent Review Flowsheet Data    There is no flowsheet data to display.       Exercise Target Goals:    Exercise Program Goal: Individual exercise prescription set with THRR, safety & activity barriers. Participant  demonstrates ability to understand and report RPE using BORG scale, to self-measure pulse accurately, and to acknowledge the importance of the exercise prescription.  Exercise Prescription Goal: Starting with aerobic activity 30 plus minutes a day, 3 days per week for initial exercise prescription. Provide home exercise prescription and guidelines that participant acknowledges understanding prior to discharge.  Activity Barriers & Risk Stratification:     Activity Barriers & Cardiac Risk Stratification - 05/21/15 1327    Activity Barriers & Cardiac Risk Stratification   Activity Barriers Joint Problems  Right shoulder arthritis.  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 HR 92 bpm  70 bpm   Max Ex. BP 162/78 mmHg  126/60 mmHg   2 Minute Post BP  130/66 mmHg    Interval Oxygen   Interval Oxygen? Yes     Baseline Oxygen Saturation % 97 %     6 Minute Oxygen Saturation % 95 %        Initial Exercise Prescription:     Initial Exercise Prescription - 05/21/15 1400    Date of Initial Exercise RX and Referring Provider   Date 05/21/15   Treadmill   MPH 2   Grade 0   Minutes 15   Recumbant Bike   Level 2   Watts 20   Minutes 15   NuStep   Level 2   Watts 40   Minutes 15   Recumbant Elliptical   Level 2   Watts 20   Minutes 15   REL-XR   Level 2   Watts 50   Minutes 15   T5 Nustep   Level 1   Watts 50   Minutes 15   Biostep-RELP   Level 1   Watts 15   Prescription Details   Frequency (times per week) 3   Duration Progress to 50  minutes of aerobic without signs/symptoms of physical distress   Intensity   THRR REST +  30   Ratings of Perceived Exertion 11-15   Perceived Dyspnea 0-4   Progression   Progression Continue to progress workloads to maintain intensity without signs/symptoms of physical distress.   Resistance Training   Training Prescription Yes   Weight 2   Reps 10-15      Perform Capillary Blood Glucose checks as needed.  Exercise Prescription Changes:     Exercise Prescription Changes      05/21/15 1400 06/12/15 0600 07/02/15 1500       Exercise Review   Progression  Yes Yes     Response to Exercise   Blood Pressure (Admit) 138/74 mmHg 148/80 mmHg 122/70 mmHg     Blood Pressure (Exercise) 162/78 mmHg 142/82 mmHg 140/56 mmHg     Blood Pressure (Exit) 130/66 mmHg 126/64 mmHg 118/64 mmHg     Heart Rate (Admit) 67 bpm 63 bpm 59 bpm     Heart Rate (Exercise) 92 bpm 78 bpm 75 bpm     Heart Rate (Exit) 73 bpm 68 bpm 63 bpm     Perceived Dyspnea (Exercise) _0 Symptoms  none none     Duration  Progress to 45 minutes of aerobic exercise without signs/symptoms of physical distress Progress to 30 minutes of continuous aerobic without signs/symptoms of physical distress     Intensity  Rest + 30 THRR New  99-130     Progression   Progression  Continue progressive overload as per policy without signs/symptoms or physical distress. Continue to progress workloads to maintain intensity without signs/symptoms of physical distress.     Average METs   3.98     Resistance Training   Training Prescription  Yes Yes     Weight  4 4     Reps  10-15 10-15     Interval Training   Interval Training  Yes No  Has not been doing intervals on treadmill     Equipment  Treadmill      Comments  2 minX30 sec intervals.Speed between 2.3/1% and 2.5/2%. Ranging 12-15 RPE. Total 20 min on TM.  Treadmill   MPH  2.5  interval training: see above comment 2.5     Grade  2 3     Minutes  20 15     Recumbant  Bike   Level  --      Watts  --      Minutes  --      NuStep   Level  4 6     Watts  40 70     Minutes  20 20     Recumbant Elliptical   Level  --      Watts  --      Minutes  --      REL-XR   Level  -- 6     Watts  -- 69     Minutes  -- 20     T5 Nustep   Level  --      Watts  --      Minutes  --      Biostep-RELP   Level  3 7     Watts  25 50        Exercise Comments:     Exercise Comments      05/27/15 0857 06/10/15 1206 06/12/15 0626 07/02/15 1525 07/17/15 1019   Exercise Comments Today was the patient's first day of class. The patient's initial exercise prescription (based on the 6 min walk evaluation) was reviewed with the patient. Gretta Cool stated that he is starting to feel stronger since starting cardiac rehab class. He stated that his doctor had told him 8 weeks after surgery he could start doing more. This 8 week post surgery time frame starts tomorrow 06/11/15. He said that he feels like he wants to start trying more daily activities and it was suggested that he try one new activitiy at a time and slowly progress as tolerated. 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  Reviewed individualized exercise prescription and made increases per departmental policy. Exercise increases were discussed with the patient and they were able to perform the new work loads without issue (no signs or symptoms).  The recommendations and benefits of interval training were reviewed with the patient.  Gretta Cool is making good progress with exercise.  He has not been doing the intervals on treadmill, will discuss with him at next session.  Pt will be encouraged to continue to increase workloads. "Gretta Cool' only has a few more sessions of Cardiac Rehab. He said after his camping vacation to [facility] the end  of June, he will decide about his future exercise program..      07/17/15 1020           Exercise Comments Gretta Cool has Silver Sneaker so can exercise for free in the [employer] (used to be called the [employer].           Discharge Exercise Prescription (Final Exercise Prescription Changes):     Exercise Prescription Changes - 07/02/15 1500    Exercise Review   Progression Yes   Response to Exercise   Blood Pressure (Admit) 122/70 mmHg   Blood Pressure (Exercise) 140/56 mmHg   Blood Pressure (Exit) 118/64 mmHg   Heart Rate (Admit) 59 bpm   Heart Rate (Exercise) 75 bpm   Heart Rate (Exit) 63 bpm  Perceived Dyspnea (Exercise) 12   Symptoms none   Duration Progress to 30 minutes of continuous aerobic without signs/symptoms of physical distress   Intensity THRR New  99-130   Progression   Progression Continue to progress workloads to maintain intensity without signs/symptoms of physical distress.   Average METs 3.98   Resistance Training   Training Prescription Yes   Weight 4   Reps 10-15   Interval Training   Interval Training No  Has not been doing intervals on treadmill   Treadmill   MPH 2.5   Grade 3   Minutes 15   NuStep   Level 6   Watts 70   Minutes 20   REL-XR   Level 6   Watts 69   Minutes 20   Biostep-RELP   Level 7   Watts 50      Nutrition:  Target Goals: Understanding of nutrition guidelines, daily intake of sodium '1500mg'$ , cholesterol '200mg'$ , calories 30% from fat and 7% or less from saturated fats, daily to have 5 or more servings of fruits and vegetables.  Biometrics:     Pre Biometrics - 05/21/15 1452    Pre Biometrics   Height '5\' 10"'$  (1.778 m)   Weight 177 lb 9.6 oz (80.559 kg)   Waist Circumference 39.5 inches   Hip Circumference 39.5 inches   Waist to Hip Ratio 1 %   BMI (Calculated) 25.5       Nutrition Therapy Plan and Nutrition Goals:     Nutrition Therapy & Goals - 06/03/15 0835    Nutrition Therapy   Diet  Instructed on a heart healthy meal plan based on 2000 calories including DASH diet principles.   Drug/Food Interactions Statins/Certain Fruits   Protein (specify units) 8   Fiber 30 grams   Whole Grain Foods 3 servings   Saturated Fats 13 max. grams   Fruits and Vegetables 5 servings/day   Sodium 2000 grams   Personal Nutrition Goals   Personal Goal #1 Try some trans free margarines such as "I Can't Believe it's not butter" or Smart Balance   Personal Goal #2 Include a protein food with each meal to help reach goal of 8 oz. per day   Personal Goal #3 To use some of the Ms. DASH seasonings.   Intervention Plan   Intervention Prescribe, educate and counsel regarding individualized specific dietary modifications aiming towards targeted core components such as weight, hypertension, lipid management, diabetes, heart failure and other comorbidities.;Nutrition handout(s) given to patient.   Expected Outcomes Short Term Goal: Understand basic principles of dietary content, such as calories, fat, sodium, cholesterol and nutrients.;Short Term Goal: A plan has been developed with personal nutrition goals set during dietitian appointment.;Long Term Goal: Adherence to prescribed nutrition plan.      Nutrition Discharge: Rate Your Plate Scores:     Nutrition Assessments - 07/08/15 1200    Rate Your Plate Scores   Pre Score 61   Pre Score % 68 %   Post Score 57   Post Score % 61 %   % Change -7 %      Nutrition Goals Re-Evaluation:     Nutrition Goals Re-Evaluation      06/07/15 0921           Personal Goal #1 Re-Evaluation   Goal Progress Seen No       Comments Blas said he doesn't eat much margaine       Personal Goal #2 Re-Evaluation   Goal Progress Seen  Yes       Comments "My appeptite is getting better but still now what it was .        Personal Goal #3 Re-Evaluation   Goal Progress Seen No          Psychosocial: Target Goals: Acknowledge presence or absence of depression,  maximize coping skills, provide positive support system. Participant is able to verbalize types and ability to use techniques and skills needed for reducing stress and depression.  Initial Review & Psychosocial Screening:     Initial Psych Review & Screening - 05/21/15 1334    Family Dynamics   Good Support System? Yes   Barriers   Psychosocial barriers to participate in program There are no identifiable barriers or psychosocial needs.;The patient should benefit from training in stress management and relaxation.   Screening Interventions   Interventions Encouraged to exercise      Quality of Life Scores:     Quality of Life - 07/08/15 1201    Quality of Life Scores   Health/Function Pre 23.5 %   Health/Function Post 24.6 %   Health/Function % Change 4.68 %   Socioeconomic Pre 23.33 %   Socioeconomic Post 24.43 %   Socioeconomic % Change  4.71 %   Psych/Spiritual Pre 22.5 %   Psych/Spiritual Post 25.07 %   Psych/Spiritual % Change 11.42 %   Family Pre 25.9 %   Family Post 22.8 %   Family % Change -11.97 %   GLOBAL Pre 23.63 %   GLOBAL Post 24.4 %   GLOBAL % Change 3.26 %      PHQ-9:     Recent Review Flowsheet Data    Depression screen [facility] 2/9 05/21/2015   Decreased Interest 0   Down, Depressed, Hopeless 0   PHQ - 2 Score 0   Altered sleeping 0   Tired, decreased energy 1   Change in appetite 2    Feeling bad or failure about yourself  0   Trouble concentrating 0   Moving slowly or fidgety/restless 0   Suicidal thoughts 0   PHQ-9 Score 3   Difficult doing work/chores Not difficult at all      Psychosocial Evaluation and Intervention:     Psychosocial Evaluation - 05/27/15 0957    Psychosocial Evaluation & Interventions   Interventions Encouraged to exercise with the program and follow exercise prescription   Comments Counselor met with Mr. Beza today for initial psychosocial evaluation.  He is a 75 year old who had open heart surgery in early March for  aortic valve replacement.  He has a strong support system with a spouse of 82 years and several adult children who live close by.  Mr. Henson is also actively involved in his local church.  He denies a history of depression or anxiety or current symptoms.  He reports to sleeping well and having a decreased appetite since the surgery.  Mr. Brandt states he is typically in a positive mood.  He has goals to increase his stamina and strength while in this program.        Psychosocial Re-Evaluation:     Psychosocial Re-Evaluation      07/12/15 0933 07/17/15 0930         Psychosocial Re-Evaluation   Interventions Encouraged to attend Cardiac Rehabilitation for the exercise       Comments Aaryan said he saw that throat doctor yesterday who told him that his voice quality may return but may not. I  suggested  his ask his MD if Speech Therapy might help. 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 He is almost done with Cardiac Rehab. I checked na dhe does have Silver Sneakers so I showed him a Silver Sneakers exercise class that was going on this am and the [employer]. He said his wife probably has Silver Sneakers too and he will let her know.  Counselor follow up with Mr. Capistran today reporting he is feeling much stronger and able to do more normal activities now since beginning this program.  He states he continues to sleep well; has minimal stress and is generally positive most of the time.  Mr. Lenn plans to look into the Silver Sneakers program here at [facility] or a [employer] where he and his wife can continue exercising consistently.           Vocational Rehabilitation: Provide vocational rehab assistance to qualifying candidates.   Vocational Rehab Evaluation & Intervention:     Vocational Rehab - 05/21/15 1323    Initial Vocational Rehab Evaluation & Intervention   Assessment shows need for Vocational Rehabilitation No      Education: Education Goals: Education classes will be provided on a weekly  basis, covering required topics. Participant will state understanding/return demonstration of topics presented.  Learning Barriers/Preferences:     Learning Barriers/Preferences - 05/21/15 1323    Learning Barriers/Preferences   Learning Barriers Hearing   Learning Preferences Individual Instruction      Education Topics: General Nutrition Guidelines/Fats and Fiber: -Group instruction provided by verbal, written material, models and posters to present the general guidelines for heart healthy nutrition. Gives an explanation and review of dietary fats and fiber.          Cardiac Rehab from 07/17/2015 in [facility] Cardiac and Pulmonary Rehab   Date  07/08/15   Educator  Jaclyn Shaggy   Instruction Review Code  2- meets goals/outcomes      Controlling Sodium/Reading Food Labels: -Group verbal and written material supporting the discussion of sodium use in heart healthy nutrition. Review and explanation with models, verbal and written materials for utilization of the food label.      Cardiac Rehab from 07/17/2015 in [facility] Cardiac and Pulmonary Rehab   Date  07/15/15   Educator  CR   Instruction Review Code  2- meets goals/outcomes      Exercise Physiology & Risk Factors: - Group verbal and written instruction with models to review the exercise physiology of the cardiovascular system and associated critical values. Details cardiovascular disease risk factors and the goals associated with each risk factor.      Cardiac Rehab from 07/17/2015 in [facility] Cardiac and Pulmonary Rehab   Date  07/17/15   Educator  [facility]   Instruction Review Code  2- meets goals/outcomes      Aerobic Exercise & Resistance Training: - Gives group verbal and written discussion on the health impact of inactivity. On the components of aerobic and resistive training programs and the benefits of this training and how to safely progress through these programs.      Cardiac Rehab from 07/17/2015 in [facility] Cardiac and Pulmonary Rehab    Date  05/27/15   Educator  RS   Instruction Review Code  2- meets goals/outcomes      Flexibility, Balance, General Exercise Guidelines: - Provides group verbal and written instruction on the benefits of flexibility and balance training programs. Provides general exercise guidelines with specific guidelines to those with heart or lung disease. Demonstration and skill  practice provided.      Cardiac Rehab from 07/17/2015 in [facility] Cardiac and Pulmonary Rehab   Date  05/29/15   Educator  bs   Instruction Review Code  2- meets goals/outcomes      Stress Management: - Provides group verbal and written instruction about the health risks of elevated stress, cause of high stress, and healthy ways to reduce stress.      Cardiac Rehab from 07/17/2015 in [facility] Cardiac and Pulmonary Rehab   Date  06/05/15   Educator  [facility]   Instruction Review Code  2- meets goals/outcomes      Depression: - Provides group verbal and written instruction on the correlation between heart/lung disease and depressed mood, treatment options, and the stigmas associated with seeking treatment.      Cardiac Rehab from 07/17/2015 in [facility] Cardiac and Pulmonary Rehab   Date  07/03/15   Educator  [facility]   Instruction Review Code  2- meets goals/outcomes      Anatomy & Physiology of the Heart: - Group verbal and written instruction and models provide basic cardiac anatomy and physiology, with the coronary electrical and arterial systems. Review of: AMI, Angina, Valve disease, Heart Failure, Cardiac Arrhythmia, Pacemakers, and the ICD.      Cardiac Rehab from 07/17/2015 in [facility] Cardiac and Pulmonary Rehab   Date  06/03/15   Educator  Sb   Instruction Review Code  2- meets goals/outcomes      Cardiac Procedures: - Group verbal and written instruction and models to describe the testing methods done to diagnose heart disease. Reviews the outcomes of the test results. Describes the treatment choices: Medical Management, Angioplasty,  or Coronary Bypass Surgery.      Cardiac Rehab from 07/17/2015 in [facility] Cardiac and Pulmonary Rehab   Date  06/10/15   Educator  CE   Instruction Review Code  2- meets goals/outcomes      Cardiac Medications: - Group verbal and written instruction to review commonly prescribed medications for heart disease. Reviews the medication, class of the drug, and side effects. Includes the steps to properly store meds and maintain the prescription regimen.      Cardiac Rehab from 07/17/2015 in [facility] Cardiac and Pulmonary Rehab   Date  06/17/15   Educator  SB   Instruction Review Code  2- meets goals/outcomes      Go Sex-Intimacy & Heart Disease, Get SMART - Goal Setting: - Group verbal and written instruction through game format to discuss heart disease and the return to sexual intimacy. Provides group verbal and written material to discuss and apply goal setting through the application of the S.M.A.R.T. Method.      Cardiac Rehab from 07/17/2015 in [facility] Cardiac and Pulmonary Rehab   Date  06/10/15   Educator  CE   Instruction Review Code  2- meets goals/outcomes      Other Matters of the Heart: - Provides group verbal, written materials and models to describe Heart Failure, Angina, Valve Disease, and Diabetes in the realm of heart disease. Includes description of the disease process and treatment options available to the cardiac patient.      Cardiac Rehab from 07/17/2015 in [facility] Cardiac and Pulmonary Rehab   Date  06/03/15   Educator  SB   Instruction Review Code  2- meets goals/outcomes      Exercise & Equipment Safety: - Individual verbal instruction and demonstration of equipment use and safety with use of the equipment.      Cardiac  Rehab from 07/17/2015 in [facility] Cardiac and Pulmonary Rehab   Date  05/21/15   Educator  SB   Instruction Review Code  2- meets goals/outcomes      Infection Prevention: - Provides verbal and written material to individual with discussion of infection  control including proper hand washing and proper equipment cleaning during exercise session.      Cardiac Rehab from 07/17/2015 in [facility] Cardiac and Pulmonary Rehab   Date  05/21/15   Educator  SB   Instruction Review Code  2- meets goals/outcomes      Falls Prevention: - Provides verbal and written material to individual with discussion of falls prevention and safety.      Cardiac Rehab from 07/17/2015 in [facility] Cardiac and Pulmonary Rehab   Date  05/21/15   Educator  SB   Instruction Review Code  2- meets goals/outcomes      Diabetes: - Individual verbal and written instruction to review signs/symptoms of diabetes, desired ranges of glucose level fasting, after meals and with exercise. Advice that pre and post exercise glucose checks will be done for 3 sessions at entry of program.    Knowledge Questionnaire Score:     Knowledge Questionnaire Score - 07/08/15 1201    Knowledge Questionnaire Score   Pre Score 21/28   Post Score 24/28      Core Components/Risk Factors/Patient Goals at Admission:     Personal Goals and Risk Factors at Admission - 05/21/15 1334    Core Components/Risk Factors/Patient Goals on Admission   Sedentary Yes   Intervention Provide advice, education, support and counseling about physical activity/exercise needs.;Develop an individualized exercise prescription for aerobic and resistive training based on initial evaluation findings, risk stratification, comorbidities and participant's personal goals.   Expected Outcomes Achievement of increased cardiorespiratory fitness and enhanced flexibility, muscular endurance and strength shown through measurements of functional capacity and personal statement of participant.   Increase Strength and Stamina Yes   Intervention Provide advice, education, support and counseling about physical activity/exercise needs.;Develop an individualized exercise prescription for aerobic and resistive training based on initial  evaluation findings, risk stratification, comorbidities and participant's personal goals.   Expected Outcomes Achievement of increased cardiorespiratory fitness and enhanced flexibility, muscular endurance and strength shown through measurements of functional capacity and personal statement of participant.   Lipids Yes   Intervention Provide education and support for participant on nutrition & aerobic/resistive exercise along with prescribed medications to achieve LDL '70mg'$ , HDL >'40mg'$ .   Expected Outcomes Short Term: Participant states understanding of desired cholesterol values and is compliant with medications prescribed. Participant is following exercise prescription and nutrition guidelines.;Long Term: Cholesterol controlled with medications as prescribed, with individualized exercise RX and with personalized nutrition plan. Value goals: LDL < '70mg'$ , HDL > 40 mg.      Core Components/Risk Factors/Patient Goals Review:      Goals and Risk Factor Review      06/07/15 1003 07/12/15 0926 07/12/15 0935       Core Components/Risk Factors/Patient Goals Review   Personal Goals Review Weight Management/Obesity;Sedentary Sedentary;Weight Management/Obesity      Review Jebediah lost 15 lbs with his last surgery and his appetite is not totally back. His weight lifting restriction is off so he will cont to [place] two 4 lb weights. He is doing more at home. Gleb said he met with the Cardiac Rehab Registered Dietician and said he has already been eating healthy.  Trejon said he saw that throat doctor yesterday who told him  that his voice quality may return but may not. I  suggested his ask his MD if Speech Therapy might help. 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 He is almost done with Cardiac Rehab. I checked na dhe does have Silver Sneakers so I showed him a Silver Sneakers exercise class that was going on this am and the [employer]. He said his wife probably has Silver Sneakers too and he will let her know.        Expected Outcomes Cont to exercise in Cardiac Rehab to compelte 36 sessions.   Possibly exercise in [employer].         Core Components/Risk Factors/Patient Goals at Discharge (Final Review):      Goals and Risk Factor Review - 07/12/15 0935    Core Components/Risk Factors/Patient Goals Review   Expected Outcomes Possibly exercise in [employer].       ITP Comments:     ITP Comments      05/21/15 1459 05/26/15 0947 06/07/15 0915 06/23/15 1246 07/17/15 1018   ITP Comments medical review completed   Initial ITP  Continue with ITP 30 day review.  Continue with ITP  New to program German said he met with the Cardiac Rehab Registered Dietician and she said his appetite may not come back for a couple of weeks still. He lost 15 lbs with his past surgery. Irfan "Gretta Cool" said his wife cooks for him and he is not sure if she has tried Mrs. Dash yet. He said that he doesn't eat much margarine so he has not tried to use Promise or other products yet but he has eaten farily healthy. Woody said MD took him off his weight lifting resistriction but he still wanted to use two 4 lbs weiht today during Cardiac Rehab Resistive training portion.  30 day review. Continue with ITP "Gretta Cool' only has a few more sessions of Cardiac Rehab. He said after his camping vacation to [facility] the end of June, he will decide about his future exercise program..       Comments:

## 2015-07-17 NOTE — Progress Notes (Signed)
Daily Session Note  Patient Details  Name: Luke Gonzales MRN: [id number] Date of Birth: Dec 19, 1940 Referring Provider:    Encounter Date: 07/17/2015  Check In:     Session Check In - 07/17/15 0840    Check-In   Location [facility]   Staff Present Gerlene Burdock, RN, BSN;Jessica Luan Pulling, MA, ACSM RCEP, Exercise Physiologist;Katheren Jimmerson Oletta Darter, BA, ACSM CEP, Exercise Physiologist   Supervising physician immediately available to respond to emergencies See telemetry face sheet for immediately available ER MD   Medication changes reported     No   Fall or balance concerns reported    No   Warm-up and Cool-down Performed on first and last piece of equipment   Resistance Training Performed Yes   VAD Patient? No   Pain Assessment   Currently in Pain? No/denies   Multiple Pain Sites No         Goals Met:  Independence with exercise equipment Exercise tolerated well No report of cardiac concerns or symptoms Strength training completed today  Goals Unmet:  Not Applicable  Comments: Pt able to follow exercise prescription today without complaint.  Will continue to monitor for progression.    Dr. Emily Filbert is Medical Director for [facility] and [facility].

## 2015-07-17 NOTE — Patient Instructions (Signed)
Discharge Instructions  Patient Details  Name: Luke Gonzales MRN: [id number] Date of Birth: 11-03-1940 Referring Provider:  Corey Skains, MD   Number of Visits:   Reason for Discharge:  Patient reached a stable level of exercise. Patient independent in their exercise.  Smoking History:  History  Smoking status  . Former Smoker -- 15 years  . Types: Cigarettes  Smokeless tobacco  . Not on file    Diagnosis:  H/O aortic valve replacement  Initial Exercise Prescription:     Initial Exercise Prescription - 05/21/15 1400    Date of Initial Exercise RX and Referring Provider   Date 05/21/15   Treadmill   MPH 2   Grade 0   Minutes 15   Recumbant Bike   Level 2   Watts 20   Minutes 15   NuStep   Level 2   Watts 40   Minutes 15   Recumbant Elliptical   Level 2   Watts 20   Minutes 15   REL-XR   Level 2   Watts 50   Minutes 15   T5 Nustep   Level 1   Watts 50   Minutes 15   Biostep-RELP   Level 1   Watts 15   Prescription Details   Frequency (times per week) 3   Duration Progress to 50 minutes of aerobic without signs/symptoms of physical distress   Intensity   THRR REST +  30   Ratings of Perceived Exertion 11-15   Perceived Dyspnea 0-4   Progression   Progression Continue to progress workloads to maintain intensity without signs/symptoms of physical distress.   Resistance Training   Training Prescription Yes   Weight 2   Reps 10-15      Discharge Exercise Prescription (Final Exercise Prescription Changes):     Exercise Prescription Changes - 07/02/15 1500    Exercise Review   Progression Yes   Response to Exercise   Blood Pressure (Admit) 122/70 mmHg   Blood Pressure (Exercise) 140/56 mmHg   Blood Pressure (Exit) 118/64 mmHg   Heart Rate (Admit) 59 bpm   Heart Rate (Exercise) 75 bpm   Heart Rate (Exit) 63 bpm   Perceived Dyspnea (Exercise) 12   Symptoms none   Duration Progress to 30 minutes of continuous aerobic without  signs/symptoms of physical distress   Intensity THRR New  99-130   Progression   Progression Continue to progress workloads to maintain intensity without signs/symptoms of physical distress.   Average METs 3.98   Resistance Training   Training Prescription Yes   Weight 4   Reps 10-15   Interval Training   Interval Training No  Has not been doing intervals on treadmill   Treadmill   MPH 2.5   Grade 3   Minutes 15   NuStep   Level 6   Watts 70   Minutes 20   REL-XR   Level 6   Watts 69   Minutes 20   Biostep-RELP   Level 7   Watts 50      Functional Capacity:     6 Minute Walk      05/21/15 1450 05/21/15 1500 07/10/15 0943   6 Minute Walk   Phase Initial  Discharge   Distance 1133 feet  1410 feet   Distance % Change   24 %   Walk Time 6 minutes  6 minutes   # of Rest Breaks   0   MPH 2.1  2.67  METS   2.67   RPE 12  11   VO2 Peak   9.37   Symptoms   No   Resting HR 67 bpm  65 bpm   Resting BP 138/74 mmHg  124/64 mmHg   Max Ex. HR 92 bpm  70 bpm   Max Ex. BP 162/78 mmHg  126/60 mmHg   2 Minute Post BP  130/66 mmHg    Interval Oxygen   Interval Oxygen? Yes     Baseline Oxygen Saturation % 97 %     6 Minute Oxygen Saturation % 95 %        Quality of Life:     Quality of Life - 07/08/15 1201    Quality of Life Scores   Health/Function Pre 23.5 %   Health/Function Post 24.6 %   Health/Function % Change 4.68 %   Socioeconomic Pre 23.33 %   Socioeconomic Post 24.43 %   Socioeconomic % Change  4.71 %   Psych/Spiritual Pre 22.5 %   Psych/Spiritual Post 25.07 %   Psych/Spiritual % Change 11.42 %   Family Pre 25.9 %   Family Post 22.8 %   Family % Change -11.97 %   GLOBAL Pre 23.63 %   GLOBAL Post 24.4 %   GLOBAL % Change 3.26 %      Personal Goals: Goals established at orientation with interventions provided to work toward goal.     Personal Goals and Risk Factors at Admission - 05/21/15 1334    Core Components/Risk Factors/Patient Goals on  Admission   Sedentary Yes   Intervention Provide advice, education, support and counseling about physical activity/exercise needs.;Develop an individualized exercise prescription for aerobic and resistive training based on initial evaluation findings, risk stratification, comorbidities and participant's personal goals.   Expected Outcomes Achievement of increased cardiorespiratory fitness and enhanced flexibility, muscular endurance and strength shown through measurements of functional capacity and personal statement of participant.   Increase Strength and Stamina Yes   Intervention Provide advice, education, support and counseling about physical activity/exercise needs.;Develop an individualized exercise prescription for aerobic and resistive training based on initial evaluation findings, risk stratification, comorbidities and participant's personal goals.   Expected Outcomes Achievement of increased cardiorespiratory fitness and enhanced flexibility, muscular endurance and strength shown through measurements of functional capacity and personal statement of participant.   Lipids Yes   Intervention Provide education and support for participant on nutrition & aerobic/resistive exercise along with prescribed medications to achieve LDL 70mg , HDL >40mg .   Expected Outcomes Short Term: Participant states understanding of desired cholesterol values and is compliant with medications prescribed. Participant is following exercise prescription and nutrition guidelines.;Long Term: Cholesterol controlled with medications as prescribed, with individualized exercise RX and with personalized nutrition plan. Value goals: LDL < 70mg , HDL > 40 mg.       Personal Goals Discharge:     Goals and Risk Factor Review - 07/12/15 0935    Core Components/Risk Factors/Patient Goals Review   Expected Outcomes Possibly exercise in [employer].       Nutrition & Weight - Outcomes:     Pre Biometrics - 05/21/15 1452     Pre Biometrics   Height 5\' 10"  (1.778 m)   Weight 177 lb 9.6 oz (80.559 kg)   Waist Circumference 39.5 inches   Hip Circumference 39.5 inches   Waist to Hip Ratio 1 %   BMI (Calculated) 25.5       Nutrition:     Nutrition Therapy & Goals -  06/03/15 0835    Nutrition Therapy   Diet Instructed on a heart healthy meal plan based on 2000 calories including DASH diet principles.   Drug/Food Interactions Statins/Certain Fruits   Protein (specify units) 8   Fiber 30 grams   Whole Grain Foods 3 servings   Saturated Fats 13 max. grams   Fruits and Vegetables 5 servings/day   Sodium 2000 grams   Personal Nutrition Goals   Personal Goal #1 Try some trans free margarines such as "I Can't Believe it's not butter" or Smart Balance   Personal Goal #2 Include a protein food with each meal to help reach goal of 8 oz. per day   Personal Goal #3 To use some of the Ms. DASH seasonings.   Intervention Plan   Intervention Prescribe, educate and counsel regarding individualized specific dietary modifications aiming towards targeted core components such as weight, hypertension, lipid management, diabetes, heart failure and other comorbidities.;Nutrition handout(s) given to patient.   Expected Outcomes Short Term Goal: Understand basic principles of dietary content, such as calories, fat, sodium, cholesterol and nutrients.;Short Term Goal: A plan has been developed with personal nutrition goals set during dietitian appointment.;Long Term Goal: Adherence to prescribed nutrition plan.      Nutrition Discharge:     Nutrition Assessments - 07/08/15 1200    Rate Your Plate Scores   Pre Score 61   Pre Score % 68 %   Post Score 57   Post Score % 61 %   % Change -7 %      Education Questionnaire Score:     Knowledge Questionnaire Score - 07/08/15 1201    Knowledge Questionnaire Score   Pre Score 21/28   Post Score 24/28      Goals reviewed with patient; copy given to patient.

## 2015-07-17 NOTE — Progress Notes (Signed)
Discharge Summary  Patient Details  Name: Luke Gonzales MRN: [id number] Date of Birth: Apr 09, 1940 Referring Provider:     Number of Visits:   Reason for Discharge:  Patient reached a stable level of exercise. Patient independent in their exercise.  Smoking History:  History  Smoking status  . Former Smoker -- 15 years  . Types: Cigarettes  Smokeless tobacco  . Not on file    Diagnosis:  H/O aortic valve replacement  ADL UCSD:   Initial Exercise Prescription:     Initial Exercise Prescription - 05/21/15 1400    Date of Initial Exercise RX and Referring Provider   Date 05/21/15   Treadmill   MPH 2   Grade 0   Minutes 15   Recumbant Bike   Level 2   Watts 20   Minutes 15   NuStep   Level 2   Watts 40   Minutes 15   Recumbant Elliptical   Level 2   Watts 20   Minutes 15   REL-XR   Level 2   Watts 50   Minutes 15   T5 Nustep   Level 1   Watts 50   Minutes 15   Biostep-RELP   Level 1   Watts 15   Prescription Details   Frequency (times per week) 3   Duration Progress to 50 minutes of aerobic without signs/symptoms of physical distress   Intensity   THRR REST +  30   Ratings of Perceived Exertion 11-15   Perceived Dyspnea 0-4   Progression   Progression Continue to progress workloads to maintain intensity without signs/symptoms of physical distress.   Resistance Training   Training Prescription Yes   Weight 2   Reps 10-15      Discharge Exercise Prescription (Final Exercise Prescription Changes):     Exercise Prescription Changes - 07/02/15 1500    Exercise Review   Progression Yes   Response to Exercise   Blood Pressure (Admit) 122/70 mmHg   Blood Pressure (Exercise) 140/56 mmHg   Blood Pressure (Exit) 118/64 mmHg   Heart Rate (Admit) 59 bpm   Heart Rate (Exercise) 75 bpm   Heart Rate (Exit) 63 bpm   Perceived Dyspnea (Exercise) 12   Symptoms none   Duration Progress to 30 minutes of continuous aerobic without signs/symptoms of  physical distress   Intensity THRR New  99-130   Progression   Progression Continue to progress workloads to maintain intensity without signs/symptoms of physical distress.   Average METs 3.98   Resistance Training   Training Prescription Yes   Weight 4   Reps 10-15   Interval Training   Interval Training No  Has not been doing intervals on treadmill   Treadmill   MPH 2.5   Grade 3   Minutes 15   NuStep   Level 6   Watts 70   Minutes 20   REL-XR   Level 6   Watts 69   Minutes 20   Biostep-RELP   Level 7   Watts 50      Functional Capacity:     6 Minute Walk      05/21/15 1450 05/21/15 1500 07/10/15 0943   6 Minute Walk   Phase Initial  Discharge   Distance 1133 feet  1410 feet   Distance % Change   24 %   Walk Time 6 minutes  6 minutes   # of Rest Breaks   0   MPH 2.1  2.67  METS   2.67   RPE 12  11   VO2 Peak   9.37   Symptoms   No   Resting HR 67 bpm  65 bpm   Resting BP 138/74 mmHg  124/64 mmHg   Max Ex. HR 92 bpm  70 bpm   Max Ex. BP 162/78 mmHg  126/60 mmHg   2 Minute Post BP  130/66 mmHg    Interval Oxygen   Interval Oxygen? Yes     Baseline Oxygen Saturation % 97 %     6 Minute Oxygen Saturation % 95 %        Psychological, QOL, Others - Outcomes: PHQ 2/9: Depression screen PHQ 2/9 05/21/2015  Decreased Interest 0  Down, Depressed, Hopeless 0  PHQ - 2 Score 0  Altered sleeping 0  Tired, decreased energy 1  Change in appetite 2  Feeling bad or failure about yourself  0  Trouble concentrating 0  Moving slowly or fidgety/restless 0  Suicidal thoughts 0  PHQ-9 Score 3  Difficult doing work/chores Not difficult at all    Quality of Life:     Quality of Life - 07/08/15 1201    Quality of Life Scores   Health/Function Pre 23.5 %   Health/Function Post 24.6 %   Health/Function % Change 4.68 %   Socioeconomic Pre 23.33 %   Socioeconomic Post 24.43 %   Socioeconomic % Change  4.71 %   Psych/Spiritual Pre 22.5 %   Psych/Spiritual  Post 25.07 %   Psych/Spiritual % Change 11.42 %   Family Pre 25.9 %   Family Post 22.8 %   Family % Change -11.97 %   GLOBAL Pre 23.63 %   GLOBAL Post 24.4 %   GLOBAL % Change 3.26 %      Personal Goals: Goals established at orientation with interventions provided to work toward goal.     Personal Goals and Risk Factors at Admission - 05/21/15 1334    Core Components/Risk Factors/Patient Goals on Admission   Sedentary Yes   Intervention Provide advice, education, support and counseling about physical activity/exercise needs.;Develop an individualized exercise prescription for aerobic and resistive training based on initial evaluation findings, risk stratification, comorbidities and participant's personal goals.   Expected Outcomes Achievement of increased cardiorespiratory fitness and enhanced flexibility, muscular endurance and strength shown through measurements of functional capacity and personal statement of participant.   Increase Strength and Stamina Yes   Intervention Provide advice, education, support and counseling about physical activity/exercise needs.;Develop an individualized exercise prescription for aerobic and resistive training based on initial evaluation findings, risk stratification, comorbidities and participant's personal goals.   Expected Outcomes Achievement of increased cardiorespiratory fitness and enhanced flexibility, muscular endurance and strength shown through measurements of functional capacity and personal statement of participant.   Lipids Yes   Intervention Provide education and support for participant on nutrition & aerobic/resistive exercise along with prescribed medications to achieve LDL <66m, HDL >425m   Expected Outcomes Short Term: Participant states understanding of desired cholesterol values and is compliant with medications prescribed. Participant is following exercise prescription and nutrition guidelines.;Long Term: Cholesterol controlled with  medications as prescribed, with individualized exercise RX and with personalized nutrition plan. Value goals: LDL < 7045mHDL > 40 mg.       Personal Goals Discharge:     Goals and Risk Factor Review      06/07/15 1003 07/12/15 0926 07/12/15 0935       Core Components/Risk Factors/Patient Goals Review  Personal Goals Review Weight Management/Obesity;Sedentary Sedentary;Weight Management/Obesity      Review Fotios lost 15 lbs with his last surgery and his appetite is not totally back. His weight lifting restriction is off so he will cont to [place] two 4 lb weights. He is doing more at home. Anival said he met with the Cardiac Rehab Registered Dietician and said he has already been eating healthy.  Alize said he saw that throat doctor yesterday who told him that his voice quality may return but may not. I  suggested his ask his MD if Speech Therapy might help. 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 He is almost done with Cardiac Rehab. I checked na dhe does have Silver Sneakers so I showed him a Silver [employer]neakers exercise class that was going on this am and the [employer]. He said his wife probably has Silver Sneakers too and he will let her know.       Expected Outcomes Cont to exercise in Cardiac Rehab to compelte 36 sessions.   Possibly exercise in [employer].         Nutrition & Weight - Outcomes:     Pre Biometrics - 05/21/15 1452    Pre Biometrics   Height _0  (1.778 m)   Weight 177 lb 9.6 oz (80.559 kg)   Waist Circumference 39.5 inches   Hip Circumference 39.5 inches   Waist to Hip Ratio 1 %   BMI (Calculated) 25.5       Nutrition:     Nutrition Therapy & Goals - 06/03/15 0835    Nutrition Therapy   Diet Instructed on a heart healthy meal plan based on 2000 calories including DASH diet principles.   Drug/Food Interactions Statins/Certain Fruits   Protein (specify units) 8   Fiber 30 grams   Whole Grain Foods 3 servings   Saturated Fats 13 max. grams   Fruits and Vegetables  5 servings/day   Sodium 2000 grams   Personal Nutrition Goals   Personal Goal #1 Try some trans free margarines such as "I Can't Believe it's not butter" or Smart Balance   Personal Goal #2 Include a protein food with each meal to help reach goal of 8 oz. per day   Personal Goal #3 To use some of the Ms. DASH seasonings.   Intervention Plan   Intervention Prescribe, educate and counsel regarding individualized specific dietary modifications aiming towards targeted core components such as weight, hypertension, lipid management, diabetes, heart failure and other comorbidities.;Nutrition handout(s) given to patient.   Expected Outcomes Short Term Goal: Understand basic principles of dietary content, such as calories, fat, sodium, cholesterol and nutrients.;Short Term Goal: A plan has been developed with personal nutrition goals set during dietitian appointment.;Long Term Goal: Adherence to prescribed nutrition plan.      Nutrition Discharge:     Nutrition Assessments - 07/08/15 1200    Rate Your Plate Scores   Pre Score 61   Pre Score % 68 %   Post Score 57   Post Score % 61 %   % Change -7 %      Education Questionnaire Score:     Knowledge Questionnaire Score - 07/08/15 1201    Knowledge Questionnaire Score   Pre Score 21/28   Post Score 24/28      Goals reviewed with patient; copy given to patient.

## 2015-07-19 ENCOUNTER — Encounter: Payer: Medicare Other | Admitting: *Deleted

## 2015-07-19 DIAGNOSIS — Z952 Presence of prosthetic heart valve: Secondary | ICD-10-CM

## 2015-07-19 NOTE — Progress Notes (Signed)
Daily Session Note  Patient Details  Name: Luke Gonzales MRN: [id number] Date of Birth: 11/17/1940 Referring Provider:    Encounter Date: 07/19/2015  Check In:     Session Check In - 07/19/15 0830    Check-In   Location [facility]   Staff Present Heath Lark, RN, BSN, CCRP;Kyson Kupper, RN, Levie Heritage, MA, ACSM RCEP, Exercise Physiologist   Supervising physician immediately available to respond to emergencies See telemetry face sheet for immediately available ER MD   Medication changes reported     No   Fall or balance concerns reported    No   Warm-up and Cool-down Performed on first and last piece of equipment   Resistance Training Performed Yes   VAD Patient? No   Pain Assessment   Currently in Pain? No/denies         Goals Met:  Proper associated with RPD/PD & O2 Sat Exercise tolerated well No report of cardiac concerns or symptoms  Goals Unmet:  Not Applicable  Comments:     Dr. Emily Filbert is Medical Director for [place] and [facility].

## 2015-07-22 ENCOUNTER — Encounter: Payer: Medicare Other | Admitting: *Deleted

## 2015-07-22 DIAGNOSIS — Z952 Presence of prosthetic heart valve: Secondary | ICD-10-CM | POA: Diagnosis not present

## 2015-07-22 NOTE — Progress Notes (Signed)
Daily Session Note  Patient Details  Name: Luke Gonzales MRN: [id number] Date of Birth: [birth year] Referring Provider:    Encounter Date: 07/22/2015  Check In:     Session Check In - 07/22/15 0808    Check-In   Location [facility]   Staff Present Alberteen Sam, MA, ACSM RCEP, Exercise Physiologist;Susanne Bice, RN, BSN, Laveda Norman, BS, ACSM CEP, Exercise Physiologist   Supervising physician immediately available to respond to emergencies See telemetry face sheet for immediately available ER MD   Medication changes reported     No   Fall or balance concerns reported    No   Warm-up and Cool-down Performed on first and last piece of equipment   Resistance Training Performed No   VAD Patient? No   Pain Assessment   Currently in Pain? No/denies   Multiple Pain Sites No         Goals Met:  Independence with exercise equipment Exercise tolerated well No report of cardiac concerns or symptoms Strength training completed today  Goals Unmet:  Not Applicable  Comments: Pt able to follow exercise prescription today without complaint.  Will continue to monitor for progression.    Dr. Emily Filbert is Medical Director for [place] and [facility].

## 2015-07-24 ENCOUNTER — Encounter: Payer: Self-pay | Admitting: *Deleted

## 2015-07-24 ENCOUNTER — Encounter: Payer: Medicare Other | Admitting: *Deleted

## 2015-07-24 DIAGNOSIS — Z952 Presence of prosthetic heart valve: Secondary | ICD-10-CM | POA: Diagnosis not present

## 2015-07-24 NOTE — Patient Instructions (Signed)
Discharge Instructions  Patient Details  Name: Luke Gonzales MRN: [id number] Date of Birth: 09/30/1940 Referring Provider:  Corey Skains, MD   Number of Visits: 36/36  Reason for Discharge:  Patient reached a stable level of exercise. Patient independent in their exercise.  Smoking History:  History  Smoking status  . Former Smoker -- 15 years  . Types: Cigarettes  Smokeless tobacco  . Not on file    Diagnosis:  H/O aortic valve replacement  Initial Exercise Prescription:     Initial Exercise Prescription - 07/24/15 0700    Date of Initial Exercise RX and Referring Provider   Referring Provider Serafina Royals MD      Discharge Exercise Prescription (Final Exercise Prescription Changes):     Exercise Prescription Changes - 07/17/15 1400    Exercise Review   Progression Yes   Response to Exercise   Blood Pressure (Admit) 122/66 mmHg   Blood Pressure (Exercise) 136/60 mmHg   Blood Pressure (Exit) 124/64 mmHg   Heart Rate (Admit) 59 bpm   Heart Rate (Exercise) 70 bpm   Heart Rate (Exit) 63 bpm   Perceived Dyspnea (Exercise) 12   Symptoms none   Duration Progress to 45 minutes of aerobic exercise without signs/symptoms of physical distress   Intensity THRR unchanged   Progression   Progression Continue to progress workloads to maintain intensity without signs/symptoms of physical distress.   Average METs 3.85   Resistance Training   Training Prescription Yes   Weight 4   Reps 10-15   Interval Training   Interval Training No   Treadmill   MPH 2.3   Grade 6.5   Minutes 20   NuStep   Level 7   Watts 80   Minutes 20   REL-XR   Level 6   Watts 69   Minutes 20   Biostep-RELP   Level 7   Watts 55   Home Exercise Plan   Plans to continue exercise at [place]:     6 Minute Walk      05/21/15 1450 05/21/15 1500 07/10/15 0943   6 Minute Walk   Phase Initial  Discharge   Distance 1133 feet  1410 feet   Distance % Change   24 %   Walk Time 6 minutes  6 minutes   # of Rest Breaks   0   MPH 2.1  2.67   METS   2.67   RPE 12  11   VO2 Peak   9.37   Symptoms   No   Resting HR 67 bpm  65 bpm   Resting BP 138/74 mmHg  124/64 mmHg   Max Ex. HR 92 bpm  70 bpm   Max Ex. BP 162/78 mmHg  126/60 mmHg   2 Minute Post BP  130/66 mmHg    Interval Oxygen   Interval Oxygen? Yes     Baseline Oxygen Saturation % 97 %     6 Minute Oxygen Saturation % 95 %        Quality of Life:     Quality of Life - 07/08/15 1201    Quality of Life Scores   Health/Function Pre 23.5 %   Health/Function Post 24.6 %   Health/Function % Change 4.68 %   Socioeconomic Pre 23.33 %   Socioeconomic Post 24.43 %   Socioeconomic % Change  4.71 %   Psych/Spiritual Pre 22.5 %   Psych/Spiritual Post 25.07 %  Psych/Spiritual % Change 11.42 %   Family Pre 25.9 %   Family Post 22.8 %   Family % Change -11.97 %   GLOBAL Pre 23.63 %   GLOBAL Post 24.4 %   GLOBAL % Change 3.26 %      Personal Goals: Goals established at orientation with interventions provided to work toward goal.     Personal Goals and Risk Factors at Admission - 05/21/15 1334    Core Components/Risk Factors/Patient Goals on Admission   Sedentary Yes   Intervention Provide advice, education, support and counseling about physical activity/exercise needs.;Develop an individualized exercise prescription for aerobic and resistive training based on initial evaluation findings, risk stratification, comorbidities and participant's personal goals.   Expected Outcomes Achievement of increased cardiorespiratory fitness and enhanced flexibility, muscular endurance and strength shown through measurements of functional capacity and personal statement of participant.   Increase Strength and Stamina Yes   Intervention Provide advice, education, support and counseling about physical activity/exercise needs.;Develop an individualized exercise prescription for aerobic  and resistive training based on initial evaluation findings, risk stratification, comorbidities and participant's personal goals.   Expected Outcomes Achievement of increased cardiorespiratory fitness and enhanced flexibility, muscular endurance and strength shown through measurements of functional capacity and personal statement of participant.   Lipids Yes   Intervention Provide education and support for participant on nutrition & aerobic/resistive exercise along with prescribed medications to achieve LDL 70mg , HDL >40mg .   Expected Outcomes Short Term: Participant states understanding of desired cholesterol values and is compliant with medications prescribed. Participant is following exercise prescription and nutrition guidelines.;Long Term: Cholesterol controlled with medications as prescribed, with individualized exercise RX and with personalized nutrition plan. Value goals: LDL < 70mg , HDL > 40 mg.       Personal Goals Discharge:     Goals and Risk Factor Review - 07/24/15 1039    Core Components/Risk Factors/Patient Goals Review   Review Luke Gonzales is going to take some time off but he said he will call us to join [employer].   Expected Outcomes Cont Heart Healthy lifestyle and exercise in the Independent [facility] free with his Silver Sneakers.       Nutrition & Weight - Outcomes:     Pre Biometrics - 05/21/15 1452    Pre Biometrics   Height 5\' 10"  (1.778 m)   Weight 177 lb 9.6 oz (80.559 kg)   Waist Circumference 39.5 inches   Hip Circumference 39.5 inches   Waist to Hip Ratio 1 %   BMI (Calculated) 25.5       Nutrition:     Nutrition Therapy & Goals - 06/03/15 0835    Nutrition Therapy   Diet Instructed on a heart healthy meal plan based on 2000 calories including DASH diet principles.   Drug/Food Interactions Statins/Certain Fruits   Protein (specify units) 8   Fiber 30 grams   Whole Grain Foods 3 servings   Saturated Fats 13 max. grams   Fruits and  Vegetables 5 servings/day   Sodium 2000 grams   Personal Nutrition Goals   Personal Goal #1 Try some trans free margarines such as "I Can't Believe it's not butter" or Smart Balance   Personal Goal #2 Include a protein food with each meal to help reach goal of 8 oz. per day   Personal Goal #3 To use some of the Ms. DASH seasonings.   Intervention Plan   Intervention Prescribe, educate and counsel regarding individualized specific dietary modifications aiming towards targeted  core components such as weight, hypertension, lipid management, diabetes, heart failure and other comorbidities.;Nutrition handout(s) given to patient.   Expected Outcomes Short Term Goal: Understand basic principles of dietary content, such as calories, fat, sodium, cholesterol and nutrients.;Short Term Goal: A plan has been developed with personal nutrition goals set during dietitian appointment.;Long Term Goal: Adherence to prescribed nutrition plan.      Nutrition Discharge:     Nutrition Assessments - 07/08/15 1200    Rate Your Plate Scores   Pre Score 61   Pre Score % 68 %   Post Score 57   Post Score % 61 %   % Change -7 %      Education Questionnaire Score:     Knowledge Questionnaire Score - 07/08/15 1201    Knowledge Questionnaire Score   Pre Score 21/28   Post Score 24/28      Goals reviewed with patient; copy given to patient.

## 2015-07-24 NOTE — Progress Notes (Signed)
Daily Session Note  Patient Details  Name: Luke Gonzales MRN: [id number] Date of Birth: Nov 19, 1940 Referring Provider:            Cardiac Rehab from 07/24/2015 in [facility]   Referring Provider  Serafina Royals MD      Encounter Date: 07/24/2015  Check In:     Session Check In - 07/24/15 0756    Check-In   Location [facility]-Cardiac & Pulmonary Rehab   Staff Present Alberteen Sam, MA, ACSM RCEP, Exercise Physiologist;Amanda Oletta Darter, BA, ACSM CEP, Exercise Physiologist;Carroll Enterkin, RN, BSN   Supervising physician immediately available to respond to emergencies See telemetry face sheet for immediately available ER MD   Medication changes reported     No   Fall or balance concerns reported    No   Warm-up and Cool-down Performed on first and last piece of equipment   Resistance Training Performed Yes   VAD Patient? No   Pain Assessment   Currently in Pain? No/denies   Multiple Pain Sites No         Goals Met:  Independence with exercise equipment Exercise tolerated well No report of cardiac concerns or symptoms Strength training completed today  Goals Unmet:  Not Applicable  Comments: Pt able to follow exercise prescription today without complaint.  Will continue to monitor for progression.    Dr. Emily Filbert is Medical Director for [place] and [facility].

## 2015-07-24 NOTE — Progress Notes (Addendum)
Cardiac Individual Treatment Plan  Patient Details  Name: Luke Gonzales MRN: [id number] Date of Birth: 1940-04-13 Referring Provider:        Cardiac Rehab from 07/24/2015 in [facility] Cardiac and Pulmonary Rehab   Referring Provider  Serafina Royals MD      Initial Encounter Date:       Cardiac Rehab from 07/24/2015 in [facility] Cardiac and Pulmonary Rehab   Referring Provider  Serafina Royals MD      Visit Diagnosis: H/O aortic valve replacement  Patient's Home Medications on Admission:  Current outpatient prescriptions:  .  allopurinol (ZYLOPRIM) 300 MG tablet, Take 300 mg by mouth daily., Disp: , Rfl:  .  amiodarone (PACERONE) 200 MG tablet, Take 200 mg by mouth 2 (two) times daily. Reported on 06/07/2015, Disp: , Rfl:  .  aspirin 81 MG chewable tablet, Chew 81 mg by mouth at bedtime. Reported on 05/21/2015, Disp: , Rfl:  .  atorvastatin (LIPITOR) 40 MG tablet, Take 40 mg by mouth every evening. , Disp: , Rfl:  .  cetirizine (ZYRTEC) 10 MG tablet, Take 10 mg by mouth daily., Disp: , Rfl:  .  metoprolol (LOPRESSOR) 50 MG tablet, Take 50 mg by mouth 2 (two) times daily., Disp: , Rfl:  .  niacin (NIASPAN) 1000 MG CR tablet, Take 1,000 mg by mouth at bedtime., Disp: , Rfl:  .  oxyCODONE (OXY IR/ROXICODONE) 5 MG immediate release tablet, Take 5 mg by mouth every 6 (six) hours as needed for severe pain., Disp: , Rfl:  .  warfarin (COUMADIN) 5 MG tablet, Take 5 mg by mouth every evening. Reported on 05/21/2015, Disp: , Rfl:   Past Medical History: Past Medical History  Diagnosis Date  . Hypertension   . Shortness of breath dyspnea   . Coronary artery disease   . Aortic stenosis s/p bicupspid valve  . BPH (benign prostatic hypertrophy)   . Gout   . Hyperlipemia     Tobacco Use: History  Smoking status  . Former Smoker -- 15 years  . Types: Cigarettes  Smokeless tobacco  . Not on file    Labs: Recent Review Flowsheet Data    There is no flowsheet data to display.       Exercise  Target Goals:    Exercise Program Goal: Individual exercise prescription set with THRR, safety & activity barriers. Participant demonstrates ability to understand and report RPE using BORG scale, to self-measure pulse accurately, and to acknowledge the importance of the exercise prescription.  Exercise Prescription Goal: Starting with aerobic activity 30 plus minutes a day, 3 days per week for initial exercise prescription. Provide home exercise prescription and guidelines that participant acknowledges understanding prior to discharge.  Activity Barriers & Risk Stratification:     Activity Barriers & Cardiac Risk Stratification - 05/21/15 1327    Activity Barriers & Cardiac Risk Stratification   Activity Barriers Joint Problems  Right shoulder arthritis.  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 HR 92 bpm  70 bpm   Max Ex. BP 162/78 mmHg  126/60 mmHg   2 Minute Post BP  130/66 mmHg    Interval Oxygen   Interval Oxygen? Yes     Baseline Oxygen Saturation % 97 %     6 Minute Oxygen Saturation % 95 %        Initial Exercise Prescription:     Initial Exercise Prescription - 07/24/15 0700    Date of Initial Exercise RX and Referring Provider   Referring Provider Serafina Royals MD      Perform Capillary Blood Glucose checks as needed.  Exercise Prescription Changes:     Exercise Prescription Changes      05/21/15 1400 06/12/15 0600 07/02/15 1500 07/17/15 1400     Exercise Review   Progression  Yes Yes Yes    Response to Exercise    Blood Pressure (Admit) 138/74 mmHg 148/80 mmHg 122/70 mmHg 122/66 mmHg    Blood Pressure (Exercise) 162/78 mmHg 142/82 mmHg 140/56 mmHg 136/60 mmHg    Blood Pressure (Exit) 130/66 mmHg 126/64 mmHg 118/64 mmHg 124/64 mmHg    Heart Rate (Admit) 67 bpm 63 bpm 59 bpm 59 bpm    Heart Rate (Exercise) 92 bpm 78 bpm 75 bpm 70 bpm    Heart Rate (Exit) 73 bpm 68 bpm 63 bpm 63 bpm    Perceived Dyspnea (Exercise) 12 12 12 12     Symptoms  none none none    Duration  Progress to 45 minutes of aerobic exercise without signs/symptoms of physical distress Progress to 30 minutes of continuous aerobic without signs/symptoms of physical distress Progress to 45 minutes of aerobic exercise without signs/symptoms of physical distress    Intensity  Rest + 30 THRR New  99-130 THRR unchanged    Progression   Progression  Continue progressive overload as per policy without signs/symptoms or physical distress. Continue to progress workloads to maintain intensity without signs/symptoms of physical distress. Continue to progress workloads to maintain intensity without signs/symptoms of physical distress.    Average METs   3.98 3.85    Resistance Training   Training Prescription  Yes Yes Yes    Weight  4 4 4     Reps  10-15 10-15 10-15    Interval Training   Interval Training  Yes No  Has not been doing intervals on treadmill No    Equipment  Treadmill      Comments  2 minX30 sec intervals.Speed between 2.3/1% and 2.5/2%. Ranging 12-15 RPE. Total 20 min on TM.       Treadmill   MPH  2.5  interval training: see above comment 2.5 2.3    Grade  2 3 6.5    Minutes  20 15 20     Recumbant Bike   Level  --      Watts  --      Minutes  --      NuStep   Level  4 6 7     Watts  40 70 80    Minutes  20 20 20     Recumbant Elliptical   Level  --      Watts  --      Minutes  --      REL-XR  Level  -- 6 6    Watts  -- 69 69    Minutes  -- 20 20    T5 Nustep   Level  --      Watts  --      Minutes  --       Biostep-RELP   Level  3 7 7     Watts  25 50 55    Home Exercise Plan   Plans to continue exercise at    [facility]       Exercise Comments:     Exercise Comments      05/27/15 0857 06/10/15 1206 06/12/15 0626 07/02/15 1525 07/17/15 1019   Exercise Comments Today was the patient's first day of class. The patient's initial exercise prescription (based on the 6 min walk evaluation) was reviewed with the patient. Luke Gonzales stated that he is starting to feel stronger since starting cardiac rehab class. He stated that his doctor had told him 8 weeks after surgery he could start doing more. This 8 week post surgery time frame starts tomorrow 06/11/15. He said that he feels like he wants to start trying more daily activities and it was suggested that he try one new activitiy at a time and slowly progress as tolerated. 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  Reviewed individualized exercise prescription and made increases per departmental policy. Exercise increases were discussed with the patient and they were able to perform the new work loads without issue (no signs or symptoms).  The recommendations and benefits of interval training were reviewed with the patient.  Luke Gonzales is making good progress with exercise.  He has not been doing the intervals on treadmill, will discuss with him at next session.  Pt will be encouraged to continue to increase workloads. "Luke Gonzales' only has a few more sessions of Cardiac Rehab. He said after his camping vacation to [facility] the end of June, he will decide about his future exercise program..      07/17/15 1020 07/17/15 1436         Exercise Comments Luke Gonzales has Silver Sneaker so can exercise for free in the [employer] (used to be called the [employer].   He has been doing great with exercise and making good progress.  We will continue to encourage him until he finishes.         Discharge Exercise Prescription (Final Exercise Prescription Changes):     Exercise Prescription Changes - 07/17/15 1400    Exercise Review   Progression Yes   Response to Exercise   Blood Pressure (Admit) 122/66 mmHg   Blood Pressure (Exercise) 136/60 mmHg   Blood Pressure (Exit) 124/64 mmHg   Heart Rate (Admit) 59 bpm   Heart Rate (Exercise) 70 bpm   Heart Rate (Exit) 63 bpm   Perceived Dyspnea (Exercise) 12   Symptoms none   Duration Progress to 45 minutes of aerobic exercise without signs/symptoms of physical distress   Intensity THRR unchanged   Progression   Progression Continue to progress workloads to maintain intensity without signs/symptoms of physical distress.   Average METs 3.85   Resistance Training   Training Prescription Yes   Weight 4   Reps 10-15   Interval Training   Interval Training No  Treadmill   MPH 2.3   Grade 6.5   Minutes 20   NuStep   Level 7   Watts 80   Minutes 20   REL-XR   Level 6   Watts 69   Minutes 20   Biostep-RELP   Level 7   Watts 55   Home Exercise Plan   Plans to continue exercise at [facility]      Nutrition:  Target Goals: Understanding of nutrition guidelines, daily intake of sodium <1571m, cholesterol <2057m calories 30% from fat and 7% or less from saturated fats, daily to have 5 or more servings of fruits and vegetables.  Biometrics:     Pre Biometrics - 05/21/15 1452    Pre Biometrics   Height 5' 10"  (1.778 m)   Weight 177 lb 9.6 oz (80.559 kg)   Waist Circumference 39.5 inches   Hip Circumference 39.5 inches   Waist to Hip Ratio 1 %   BMI (Calculated) 25.5       Nutrition Therapy Plan and Nutrition Goals:     Nutrition Therapy & Goals - 06/03/15 0835    Nutrition Therapy   Diet Instructed on a heart healthy meal plan based on 2000 calories including DASH diet  principles.   Drug/Food Interactions Statins/Certain Fruits   Protein (specify units) 8   Fiber 30 grams   Whole Grain Foods 3 servings   Saturated Fats 13 max. grams   Fruits and Vegetables 5 servings/day   Sodium 2000 grams   Personal Nutrition Goals   Personal Goal #1 Try some trans free margarines such as "I Can't Believe it's not butter" or Smart Balance   Personal Goal #2 Include a protein food with each meal to help reach goal of 8 oz. per day   Personal Goal #3 To use some of the Ms. DASH seasonings.   Intervention Plan   Intervention Prescribe, educate and counsel regarding individualized specific dietary modifications aiming towards targeted core components such as weight, hypertension, lipid management, diabetes, heart failure and other comorbidities.;Nutrition handout(s) given to patient.   Expected Outcomes Short Term Goal: Understand basic principles of dietary content, such as calories, fat, sodium, cholesterol and nutrients.;Short Term Goal: A plan has been developed with personal nutrition goals set during dietitian appointment.;Long Term Goal: Adherence to prescribed nutrition plan.      Nutrition Discharge: Rate Your Plate Scores:     Nutrition Assessments - 07/08/15 1200    Rate Your Plate Scores   Pre Score 61   Pre Score % 68 %   Post Score 57   Post Score % 61 %   % Change -7 %      Nutrition Goals Re-Evaluation:     Nutrition Goals Re-Evaluation      06/07/15 0921           Personal Goal #1 Re-Evaluation   Goal Progress Seen No       Comments Luke Gonzales he doesn't eat much margaine       Personal Goal #2 Re-Evaluation   Goal Progress Seen Yes       Comments "My appeptite is getting better but still now what it was .        Personal Goal #3 Re-Evaluation   Goal Progress Seen No          Psychosocial: Target Goals: Acknowledge presence or absence of depression, maximize coping skills, provide positive support system. Participant is able to  verbalize types and ability to use techniques  and skills needed for reducing stress and depression.  Initial Review & Psychosocial Screening:     Initial Psych Review & Screening - 05/21/15 1334    Family Dynamics   Good Support System? Yes   Barriers   Psychosocial barriers to participate in program There are no identifiable barriers or psychosocial needs.;The patient should benefit from training in stress management and relaxation.   Screening Interventions   Interventions Encouraged to exercise      Quality of Life Scores:     Quality of Life - 07/08/15 1201    Quality of Life Scores   Health/Function Pre 23.5 %   Health/Function Post 24.6 %   Health/Function % Change 4.68 %   Socioeconomic Pre 23.33 %   Socioeconomic Post 24.43 %   Socioeconomic % Change  4.71 %   Psych/Spiritual Pre 22.5 %   Psych/Spiritual Post 25.07 %   Psych/Spiritual % Change 11.42 %   Family Pre 25.9 %   Family Post 22.8 %   Family % Change -11.97 %   GLOBAL Pre 23.63 %   GLOBAL Post 24.4 %   GLOBAL % Change 3.26 %      PHQ-9:     Recent Review Flowsheet Data    Depression screen [employer] 2/9 05/21/2015   Decreased Interest 0   Down, Depressed, Hopeless 0   PHQ - 2 Score 0   Altered sleeping 0   Tired, decreased energy 1   Change in appetite 2    Feeling bad or failure about yourself  0   Trouble concentrating 0   Moving slowly or fidgety/restless 0   Suicidal thoughts 0   PHQ-9 Score 3   Difficult doing work/chores Not difficult at all      Psychosocial Evaluation and Intervention:     Psychosocial Evaluation - 07/24/15 0958    Discharge Psychosocial Assessment & Intervention   Comments Counselor met with Luke Gonzales today for his discharge summary on his last day in this program.  He reports that his family has noticed he looks and feels better with more energy.   He continues to maintain a positive attitude and has minimal stress in his life.  He is headed to the beach for vacation  later this week and plans to look into the [employer] program here at [facility] upon his return to continue consistency in his exercise routine.  Counselor commended him on his progress and success in meeting his goals.        Psychosocial Re-Evaluation:     Psychosocial Re-Evaluation      07/12/15 0933 07/17/15 0930         Psychosocial Re-Evaluation   Interventions Encouraged to attend Cardiac Rehabilitation for the exercise       Comments Luke Gonzales said he saw that throat doctor yesterday who told him that his voice quality may return but may not. I  suggested his ask his MD if Speech Therapy might help. 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 He is almost done with Cardiac Rehab. I checked na dhe does have Silver Sneakers so I showed him a Silver Sneakers exercise class that was going on this am and the [employer]. He said his wife probably has Silver Sneakers too and he will let her know.  Counselor follow up with Luke Gonzales today reporting he is feeling much stronger and able to do more normal activities now since beginning this program.  He states he continues to sleep well; has minimal stress and  is generally positive most of the time.  Luke Gonzales plans to look into the Silver [employer]neakers program here at [facility] or a [employer] where he and his wife can continue exercising consistently.           Vocational Rehabilitation: Provide vocational rehab assistance to qualifying candidates.   Vocational Rehab Evaluation & Intervention:     Vocational Rehab - 05/21/15 1323    Initial Vocational Rehab Evaluation & Intervention   Assessment shows need for Vocational Rehabilitation No      Education: Education Goals: Education classes will be provided on a weekly basis, covering required topics. Participant will state understanding/return demonstration of topics presented.  Learning Barriers/Preferences:     Learning Barriers/Preferences - 05/21/15 1323    Learning Barriers/Preferences   Learning  Barriers Hearing   Learning Preferences Individual Instruction      Education Topics: General Nutrition Guidelines/Fats and Fiber: -Group instruction provided by verbal, written material, models and posters to present the general guidelines for heart healthy nutrition. Gives an explanation and review of dietary fats and fiber.          Cardiac Rehab from 07/24/2015 in [facility] Cardiac and Pulmonary Rehab   Date  07/08/15   Educator  Jaclyn Shaggy   Instruction Review Code  2- meets goals/outcomes      Controlling Sodium/Reading Food Labels: -Group verbal and written material supporting the discussion of sodium use in heart healthy nutrition. Review and explanation with models, verbal and written materials for utilization of the food label.      Cardiac Rehab from 07/24/2015 in [facility] Cardiac and Pulmonary Rehab   Date  07/15/15   Educator  CR   Instruction Review Code  2- meets goals/outcomes      Exercise Physiology & Risk Factors: - Group verbal and written instruction with models to review the exercise physiology of the cardiovascular system and associated critical values. Details cardiovascular disease risk factors and the goals associated with each risk factor.      Cardiac Rehab from 07/24/2015 in [facility] Cardiac and Pulmonary Rehab   Date  07/17/15   Educator  [facility]   Instruction Review Code  2- meets goals/outcomes      Aerobic Exercise & Resistance Training: - Gives group verbal and written discussion on the health impact of inactivity. On the components of aerobic and resistive training programs and the benefits of this training and how to safely progress through these programs.      Cardiac Rehab from 07/24/2015 in [facility] Cardiac and Pulmonary Rehab   Date  07/22/15   Educator  [facility]   Instruction Review Code  R- Review/reinforce [Second Class]      Flexibility, Balance, General Exercise Guidelines: - Provides group verbal and written instruction on the benefits of flexibility and  balance training programs. Provides general exercise guidelines with specific guidelines to those with heart or lung disease. Demonstration and skill practice provided.      Cardiac Rehab from 07/24/2015 in [facility] Cardiac and Pulmonary Rehab   Date  07/24/15   Educator  AS   Instruction Review Code  R- Review/reinforce [Second Class]      Stress Management: - Provides group verbal and written instruction about the health risks of elevated stress, cause of high stress, and healthy ways to reduce stress.      Cardiac Rehab from 07/24/2015 in [facility] Cardiac and Pulmonary Rehab   Date  06/05/15   Educator  [facility]   Instruction Review Code  2- meets  goals/outcomes      Depression: - Provides group verbal and written instruction on the correlation between heart/lung disease and depressed mood, treatment options, and the stigmas associated with seeking treatment.      Cardiac Rehab from 07/24/2015 in [facility] Cardiac and Pulmonary Rehab   Date  07/03/15   Educator  [facility]   Instruction Review Code  2- meets goals/outcomes      Anatomy & Physiology of the Heart: - Group verbal and written instruction and models provide basic cardiac anatomy and physiology, with the coronary electrical and arterial systems. Review of: AMI, Angina, Valve disease, Heart Failure, Cardiac Arrhythmia, Pacemakers, and the ICD.      Cardiac Rehab from 07/24/2015 in [facility] Cardiac and Pulmonary Rehab   Date  06/03/15   Educator  Sb   Instruction Review Code  2- meets goals/outcomes      Cardiac Procedures: - Group verbal and written instruction and models to describe the testing methods done to diagnose heart disease. Reviews the outcomes of the test results. Describes the treatment choices: Medical Management, Angioplasty, or Coronary Bypass Surgery.      Cardiac Rehab from 07/24/2015 in [facility] Cardiac and Pulmonary Rehab   Date  06/10/15   Educator  CE   Instruction Review Code  2- meets goals/outcomes      Cardiac  Medications: - Group verbal and written instruction to review commonly prescribed medications for heart disease. Reviews the medication, class of the drug, and side effects. Includes the steps to properly store meds and maintain the prescription regimen.      Cardiac Rehab from 07/24/2015 in [facility] Cardiac and Pulmonary Rehab   Date  06/17/15   Educator  SB   Instruction Review Code  2- meets goals/outcomes      Go Sex-Intimacy & Heart Disease, Get SMART - Goal Setting: - Group verbal and written instruction through game format to discuss heart disease and the return to sexual intimacy. Provides group verbal and written material to discuss and apply goal setting through the application of the S.M.A.R.T. Method.      Cardiac Rehab from 07/24/2015 in [facility] Cardiac and Pulmonary Rehab   Date  06/10/15   Educator  CE   Instruction Review Code  2- meets goals/outcomes      Other Matters of the Heart: - Provides group verbal, written materials and models to describe Heart Failure, Angina, Valve Disease, and Diabetes in the realm of heart disease. Includes description of the disease process and treatment options available to the cardiac patient.      Cardiac Rehab from 07/24/2015 in [facility] Cardiac and Pulmonary Rehab   Date  06/03/15   Educator  SB   Instruction Review Code  2- meets goals/outcomes      Exercise & Equipment Safety: - Individual verbal instruction and demonstration of equipment use and safety with use of the equipment.      Cardiac Rehab from 07/24/2015 in [facility] Cardiac and Pulmonary Rehab   Date  05/21/15   Educator  SB   Instruction Review Code  2- meets goals/outcomes      Infection Prevention: - Provides verbal and written material to individual with discussion of infection control including proper hand washing and proper equipment cleaning during exercise session.      Cardiac Rehab from 07/24/2015 in [facility] Cardiac and Pulmonary Rehab   Date  05/21/15   Educator  SB    Instruction Review Code  2- meets goals/outcomes      Falls  Prevention: - Provides verbal and written material to individual with discussion of falls prevention and safety.      Cardiac Rehab from 07/24/2015 in [facility] Cardiac and Pulmonary Rehab   Date  05/21/15   Educator  SB   Instruction Review Code  2- meets goals/outcomes      Diabetes: - Individual verbal and written instruction to review signs/symptoms of diabetes, desired ranges of glucose level fasting, after meals and with exercise. Advice that pre and post exercise glucose checks will be done for 3 sessions at entry of program.    Knowledge Questionnaire Score:     Knowledge Questionnaire Score - 07/08/15 1201    Knowledge Questionnaire Score   Pre Score 21/28   Post Score 24/28      Core Components/Risk Factors/Patient Goals at Admission:     Personal Goals and Risk Factors at Admission - 05/21/15 1334    Core Components/Risk Factors/Patient Goals on Admission   Sedentary Yes   Intervention Provide advice, education, support and counseling about physical activity/exercise needs.;Develop an individualized exercise prescription for aerobic and resistive training based on initial evaluation findings, risk stratification, comorbidities and participant's personal goals.   Expected Outcomes Achievement of increased cardiorespiratory fitness and enhanced flexibility, muscular endurance and strength shown through measurements of functional capacity and personal statement of participant.   Increase Strength and Stamina Yes   Intervention Provide advice, education, support and counseling about physical activity/exercise needs.;Develop an individualized exercise prescription for aerobic and resistive training based on initial evaluation findings, risk stratification, comorbidities and participant's personal goals.   Expected Outcomes Achievement of increased cardiorespiratory fitness and enhanced flexibility, muscular endurance  and strength shown through measurements of functional capacity and personal statement of participant.   Lipids Yes   Intervention Provide education and support for participant on nutrition & aerobic/resistive exercise along with prescribed medications to achieve LDL <62m, HDL >443m   Expected Outcomes Short Term: Participant states understanding of desired cholesterol values and is compliant with medications prescribed. Participant is following exercise prescription and nutrition guidelines.;Long Term: Cholesterol controlled with medications as prescribed, with individualized exercise RX and with personalized nutrition plan. Value goals: LDL < 7066mHDL > 40 mg.      Core Components/Risk Factors/Patient Goals Review:      Goals and Risk Factor Review      06/07/15 1003 07/12/15 0926 07/12/15 0935 07/24/15 1039     Core Components/Risk Factors/Patient Goals Review   Personal Goals Review Weight Management/Obesity;Sedentary Sedentary;Weight Management/Obesity      Review Luke Gonzales 15 lbs with his last surgery and his appetite is not totally back. His weight lifting restriction is off so he will cont to us Luke Gonzales 4 lb weights. He is doing more at home. Luke Gonzales he met with the Cardiac Rehab Registered Dietician and said he has already been eating healthy.  Luke Gonzales he saw that throat doctor yesterday who told him that his voice quality may return but may not. I  suggested his ask his MD if Speech Therapy might help. 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 He is almost done with Cardiac Rehab. I checked na dhe does have Silver Sneakers so I showed him a Silver Sneakers exercise class that was going on this am and the [employer]. He said his wife probably has Silver Sneakers too and he will let her know.   Luke Gonzales going to take some time off but he said he will call us [place] join [employer]  Expected Outcomes Cont to exercise in Cardiac Rehab to compelte 36 sessions.   Possibly  exercise in [employer].  Cont Heart Healthy lifestyle and exercise in the [employer] free with his Silver Sneakers.        Core Components/Risk Factors/Patient Goals at Discharge (Final Review):      Goals and Risk Factor Review - 07/24/15 1039    Core Components/Risk Factors/Patient Goals Review   Review Luke Gonzales is going to take some time off but he said he will call us to join [employer].   Expected Outcomes Cont Heart Healthy lifestyle and exercise in the [employer] free with his Silver Sneakers.       ITP Comments:     ITP Comments      05/21/15 1459 05/26/15 0947 06/07/15 0915 06/23/15 1246 07/17/15 1018   ITP Comments medical review completed   Initial ITP  Continue with ITP 30 day review.  Continue with ITP  New to program Luke Gonzales said he met with the Cardiac Rehab Registered Dietician and she said his appetite may not come back for a couple of weeks still. He lost 15 lbs with his past surgery. Luke Gonzales "Luke Gonzales" said his wife cooks for him and he is not sure if she has tried Mrs. Dash yet. He said that he doesn't eat much margarine so he has not tried to use Promise or other products yet but he has eaten farily healthy. Woody said MD took him off his weight lifting resistriction but he still wanted to use two 4 lbs weiht today during Cardiac Rehab Resistive training portion.  30 day review. Continue with ITP "Luke Gonzales' only has a few more sessions of Cardiac Rehab. He said after his camping vacation to [facility] the end of June, he will decide about his future exercise program..      07/24/15 0726           ITP Comments 30 day review.  Continue with ITP          Comments:Iyad "Geri [place]in[place] completed 36/36 Cardiac Rehab sessions today!

## 2015-07-24 NOTE — Progress Notes (Signed)
Discharge Summary  Patient Details  Name: Luke Gonzales MRN: [id number] Date of Birth: [birth year] Referring Provider:  Dr. Nehemiah Gonzales      Cardiac Rehab from 07/24/2015 in [facility] Cardiac and Pul[facility] Rehab   Referring Provider  Luke Royals MD       Number of Visits: 36  Reason for Discharge:  Patient reached a stable level of exercise. Patient independent in their exercise.  Smoking History:  History  Smoking status  . Former Smoker -- 15 years  . Types: Cigarettes  Smokeless tobacco  . Not on file    Diagnosis:  H/O aortic valve replacement  ADL UCSD:   Initial Exercise Prescription:     Initial Exercise Prescription - 07/24/15 0700    Date of Initial Exercise RX and Referring Provider   Referring Provider Luke Royals MD      Discharge Exercise Prescription (Final Exercise Prescription Changes):     Exercise Prescription Changes - 07/17/15 1400    Exercise Review   Progression Yes   Response to Exercise   Blood Pressure (Admit) 122/66 mmHg   Blood Pressure (Exercise) 136/60 mmHg   Blood Pressure (Exit) 124/64 mmHg   Heart Rate (Admit) 59 bpm   Heart Rate (Exercise) 70 bpm   Heart Rate (Exit) 63 bpm   Perceived Dyspnea (Exercise) 12   Symptoms none   Duration Progress to 45 minutes of aerobic exercise without signs/symptoms of physical distress   Intensity THRR unchanged   Progression   Progression Continue to progress workloads to maintain intensity without signs/symptoms of physical distress.   Average METs 3.85   Resistance Training   Training Prescription Yes   Weight 4   Reps 10-15   Interval Training   Interval Training No   Treadmill   MPH 2.3   Grade 6.5   Minutes 20   NuStep   Level 7   Watts 80   Minutes 20   REL-XR   Level 6   Watts 69   Minutes 20   Biostep-RELP   Level 7   Watts 55   Home Exercise Plan   Plans to continue exercise at [place]:     6 Minute Walk    05/21/15 1450 05/21/15 1500 07/10/15 0943   6 Minute Walk   Phase Initial  Discharge   Distance 1133 feet  1410 feet   Distance % Change   24 %   Walk Time 6 minutes  6 minutes   # of Rest Breaks   0   MPH 2.1  2.67   METS   2.67   RPE 12  11   VO2 Peak   9.37   Symptoms   No   Resting HR 67 bpm  65 bpm   Resting BP 138/74 mmHg  124/64 mmHg   Max Ex. HR 92 bpm  70 bpm   Max Ex. BP 162/78 mmHg  126/60 mmHg   2 Minute Post BP  130/66 mmHg    Interval Oxygen   Interval Oxygen? Yes     Baseline Oxygen Saturation % 97 %     6 Minute Oxygen Saturation % 95 %        Psychological, QOL, Others - Outcomes: PHQ 2/9: Depression screen PHQ 2/9 05/21/2015  Decreased Interest 0  Down, Depressed, Hopeless 0  PHQ - 2 Score 0  Altered sleeping 0  Tired, decreased energy 1  Change in appetite 2  Feeling bad  or failure about yourself  0  Trouble concentrating 0  Moving slowly or fidgety/restless 0  Suicidal thoughts 0  PHQ-9 Score 3  Difficult doing work/chores Not difficult at all    Quality of Life:     Quality of Life - 07/08/15 1201    Quality of Life Scores   Health/Function Pre 23.5 %   Health/Function Post 24.6 %   Health/Function % Change 4.68 %   Socioeconomic Pre 23.33 %   Socioeconomic Post 24.43 %   Socioeconomic % Change  4.71 %   Psych/Spiritual Pre 22.5 %   Psych/Spiritual Post 25.07 %   Psych/Spiritual % Change 11.42 %   Family Pre 25.9 %   Family Post 22.8 %   Family % Change -11.97 %   GLOBAL Pre 23.63 %   GLOBAL Post 24.4 %   GLOBAL % Change 3.26 %      Personal Goals: Goals established at orientation with interventions provided to work toward goal.     Personal Goals and Risk Factors at Admission - 05/21/15 1334    Core Components/Risk Factors/Patient Goals on Admission   Sedentary Yes   Intervention Provide advice, education, support and counseling about physical activity/exercise needs.;Develop an individualized exercise prescription for  aerobic and resistive training based on initial evaluation findings, risk stratification, comorbidities and participant's personal goals.   Expected Outcomes Achievement of increased cardiorespiratory fitness and enhanced flexibility, muscular endurance and strength shown through measurements of functional capacity and personal statement of participant.   Increase Strength and Stamina Yes   Intervention Provide advice, education, support and counseling about physical activity/exercise needs.;Develop an individualized exercise prescription for aerobic and resistive training based on initial evaluation findings, risk stratification, comorbidities and participant's personal goals.   Expected Outcomes Achievement of increased cardiorespiratory fitness and enhanced flexibility, muscular endurance and strength shown through measurements of functional capacity and personal statement of participant.   Lipids Yes   Intervention Provide education and support for participant on nutrition & aerobic/resistive exercise along with prescribed medications to achieve LDL <20m, HDL >45m   Expected Outcomes Short Term: Participant states understanding of desired cholesterol values and is compliant with medications prescribed. Participant is following exercise prescription and nutrition guidelines.;Long Term: Cholesterol controlled with medications as prescribed, with individualized exercise RX and with personalized nutrition plan. Value goals: LDL < 7025mHDL > 40 mg.       Personal Goals Discharge:     Goals and Risk Factor Review      06/07/15 1003 07/12/15 0926 07/12/15 0935 07/24/15 1039     Core Components/Risk Factors/Patient Goals Review   Personal Goals Review Weight Management/Obesity;Sedentary Sedentary;Weight Management/Obesity      Review [name] 15 lbs with his last surgery and his appetite is not totally back. His weight lifting restriction is off so he will cont to us [place] 4 lb weights. He is  doing more at home. JamDeanid he met with the Cardiac Rehab Registered Dietician and said he has already been eating healthy.  JamDonnaid he saw that throat doctor yesterday who told him that his voice quality may return but may not. I  suggested his ask his MD if Speech Therapy might help. 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 He is almost done with Cardiac Rehab. I checked na dhe does have Silver Sneakers so I showed him a Silver [employer]neakers exercise class that was going on this am and the [employer]. He said his wife probably has Silver Sneakers too and  he will let her know.   Frederico is going to take some time off but he said he will call us to join [employer].    Expected Outcomes Cont to exercise in Cardiac Rehab to compelte 36 sessions.   Possibly exercise in [employer].  Cont Heart Healthy lifestyle and exercise in the [employer] free with his Silver Sneakers.        Nutrition & Weight - Outcomes:     Pre Biometrics - 05/21/15 1452    Pre Biometrics   Height _0  (1.778 m)   Weight 177 lb 9.6 oz (80.559 kg)   Waist Circumference 39.5 inches   Hip Circumference 39.5 inches   Waist to Hip Ratio 1 %   BMI (Calculated) 25.5       Nutrition:     Nutrition Therapy & Goals - 06/03/15 0835    Nutrition Therapy   Diet Instructed on a heart healthy meal plan based on 2000 calories including DASH diet principles.   Drug/Food Interactions Statins/Certain Fruits   Protein (specify units) 8   Fiber 30 grams   Whole Grain Foods 3 servings   Saturated Fats 13 max. grams   Fruits and Vegetables 5 servings/day   Sodium 2000 grams   Personal Nutrition Goals   Personal Goal #1 Try some trans free margarines such as "I Can't Believe it's not butter" or Smart Balance   Personal Goal #2 Include a protein food with each meal to help reach goal of 8 oz. per day   Personal Goal #3 To use some of the Ms. DASH seasonings.   Intervention Plan   Intervention Prescribe, educate  and counsel regarding individualized specific dietary modifications aiming towards targeted core components such as weight, hypertension, lipid management, diabetes, heart failure and other comorbidities.;Nutrition handout(s) given to patient.   Expected Outcomes Short Term Goal: Understand basic principles of dietary content, such as calories, fat, sodium, cholesterol and nutrients.;Short Term Goal: A plan has been developed with personal nutrition goals set during dietitian appointment.;Long Term Goal: Adherence to prescribed nutrition plan.      Nutrition Discharge:     Nutrition Assessments - 07/08/15 1200    Rate Your Plate Scores   Pre Score 61   Pre Score % 68 %   Post Score 57   Post Score % 61 %   % Change -7 %      Education Questionnaire Score:     Knowledge Questionnaire Score - 07/08/15 1201    Knowledge Questionnaire Score   Pre Score 21/28   Post Score 24/28      Goals reviewed with patient; copy given to patient.

## 2015-07-24 NOTE — Progress Notes (Signed)
Discharge Summary  Patient Details  Name: Luke Gonzales MRN: [id number] Date of Birth: [birth year] Referring Provider:        Cardiac Rehab from 07/24/2015 in [facility] Cardiac and Pulmonary Rehab   Referring Provider  Serafina Royals MD       Number of Visits: 36/36 sessions  Reason for Discharge:  Patient reached a stable level of exercise. Patient independent in their exercise.  Smoking History:  History  Smoking status  . Former Smoker -- 15 years  . Types: Cigarettes  Smokeless tobacco  . Not on file    Diagnosis:  H/O aortic valve replacement  ADL UCSD:   Initial Exercise Prescription:     Initial Exercise Prescription - 07/24/15 0700    Date of Initial Exercise RX and Referring Provider   Referring Provider Serafina Royals MD      Discharge Exercise Prescription (Final Exercise Prescription Changes):     Exercise Prescription Changes - 07/17/15 1400    Exercise Review   Progression Yes   Response to Exercise   Blood Pressure (Admit) 122/66 mmHg   Blood Pressure (Exercise) 136/60 mmHg   Blood Pressure (Exit) 124/64 mmHg   Heart Rate (Admit) 59 bpm   Heart Rate (Exercise) 70 bpm   Heart Rate (Exit) 63 bpm   Perceived Dyspnea (Exercise) 12   Symptoms none   Duration Progress to 45 minutes of aerobic exercise without signs/symptoms of physical distress   Intensity THRR unchanged   Progression   Progression Continue to progress workloads to maintain intensity without signs/symptoms of physical distress.   Average METs 3.85   Resistance Training   Training Prescription Yes   Weight 4   Reps 10-15   Interval Training   Interval Training No   Treadmill   MPH 2.3   Grade 6.5   Minutes 20   NuStep   Level 7   Watts 80   Minutes 20   REL-XR   Level 6   Watts 69   Minutes 20   Biostep-RELP   Level 7   Watts 55   Home Exercise Plan   Plans to continue exercise at [place]:     6 Minute Walk    05/21/15 1450 05/21/15 1500 07/10/15 0943   6 Minute Walk   Phase Initial  Discharge   Distance 1133 feet  1410 feet   Distance % Change   24 %   Walk Time 6 minutes  6 minutes   # of Rest Breaks   0   MPH 2.1  2.67   METS   2.67   RPE 12  11   VO2 Peak   9.37   Symptoms   No   Resting HR 67 bpm  65 bpm   Resting BP 138/74 mmHg  124/64 mmHg   Max Ex. HR 92 bpm  70 bpm   Max Ex. BP 162/78 mmHg  126/60 mmHg   2 Minute Post BP  130/66 mmHg    Interval Oxygen   Interval Oxygen? Yes     Baseline Oxygen Saturation % 97 %     6 Minute Oxygen Saturation % 95 %        Psychological, QOL, Others - Outcomes: PHQ 2/9: Depression screen PHQ 2/9 05/21/2015  Decreased Interest 0  Down, Depressed, Hopeless 0  PHQ - 2 Score 0  Altered sleeping 0  Tired, decreased energy 1  Change in appetite 2  Feeling bad  or failure about yourself  0  Trouble concentrating 0  Moving slowly or fidgety/restless 0  Suicidal thoughts 0  PHQ-9 Score 3  Difficult doing work/chores Not difficult at all    Quality of Life:     Quality of Life - 07/08/15 1201    Quality of Life Scores   Health/Function Pre 23.5 %   Health/Function Post 24.6 %   Health/Function % Change 4.68 %   Socioeconomic Pre 23.33 %   Socioeconomic Post 24.43 %   Socioeconomic % Change  4.71 %   Psych/Spiritual Pre 22.5 %   Psych/Spiritual Post 25.07 %   Psych/Spiritual % Change 11.42 %   Family Pre 25.9 %   Family Post 22.8 %   Family % Change -11.97 %   GLOBAL Pre 23.63 %   GLOBAL Post 24.4 %   GLOBAL % Change 3.26 %      Personal Goals: Goals established at orientation with interventions provided to work toward goal.     Personal Goals and Risk Factors at Admission - 05/21/15 1334    Core Components/Risk Factors/Patient Goals on Admission   Sedentary Yes   Intervention Provide advice, education, support and counseling about physical activity/exercise needs.;Develop an individualized exercise prescription for  aerobic and resistive training based on initial evaluation findings, risk stratification, comorbidities and participant's personal goals.   Expected Outcomes Achievement of increased cardiorespiratory fitness and enhanced flexibility, muscular endurance and strength shown through measurements of functional capacity and personal statement of participant.   Increase Strength and Stamina Yes   Intervention Provide advice, education, support and counseling about physical activity/exercise needs.;Develop an individualized exercise prescription for aerobic and resistive training based on initial evaluation findings, risk stratification, comorbidities and participant's personal goals.   Expected Outcomes Achievement of increased cardiorespiratory fitness and enhanced flexibility, muscular endurance and strength shown through measurements of functional capacity and personal statement of participant.   Lipids Yes   Intervention Provide education and support for participant on nutrition & aerobic/resistive exercise along with prescribed medications to achieve LDL <20m, HDL >45m   Expected Outcomes Short Term: Participant states understanding of desired cholesterol values and is compliant with medications prescribed. Participant is following exercise prescription and nutrition guidelines.;Long Term: Cholesterol controlled with medications as prescribed, with individualized exercise RX and with personalized nutrition plan. Value goals: LDL < 7025mHDL > 40 mg.       Personal Goals Discharge:     Goals and Risk Factor Review      06/07/15 1003 07/12/15 0926 07/12/15 0935 07/24/15 1039     Core Components/Risk Factors/Patient Goals Review   Personal Goals Review Weight Management/Obesity;Sedentary Sedentary;Weight Management/Obesity      Review [name] 15 lbs with his last surgery and his appetite is not totally back. His weight lifting restriction is off so he will cont to us [place] 4 lb weights. He is  doing more at home. JamDeanid he met with the Cardiac Rehab Registered Dietician and said he has already been eating healthy.  JamDonnaid he saw that throat doctor yesterday who told him that his voice quality may return but may not. I  suggested his ask his MD if Speech Therapy might help. 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 He is almost done with Cardiac Rehab. I checked na dhe does have Silver Sneakers so I showed him a Silver Sneakers exercise class that was going on this am and the [employer]. He said his wife probably has Silver Sneakers too and  he will let her know.   Frederico is going to take some time off but he said he will call us to join [employer].    Expected Outcomes Cont to exercise in Cardiac Rehab to compelte 36 sessions.   Possibly exercise in [employer].  Cont Heart Healthy lifestyle and exercise in the [employer] free with his Silver Sneakers.        Nutrition & Weight - Outcomes:     Pre Biometrics - 05/21/15 1452    Pre Biometrics   Height _0  (1.778 m)   Weight 177 lb 9.6 oz (80.559 kg)   Waist Circumference 39.5 inches   Hip Circumference 39.5 inches   Waist to Hip Ratio 1 %   BMI (Calculated) 25.5       Nutrition:     Nutrition Therapy & Goals - 06/03/15 0835    Nutrition Therapy   Diet Instructed on a heart healthy meal plan based on 2000 calories including DASH diet principles.   Drug/Food Interactions Statins/Certain Fruits   Protein (specify units) 8   Fiber 30 grams   Whole Grain Foods 3 servings   Saturated Fats 13 max. grams   Fruits and Vegetables 5 servings/day   Sodium 2000 grams   Personal Nutrition Goals   Personal Goal #1 Try some trans free margarines such as "I Can't Believe it's not butter" or Smart Balance   Personal Goal #2 Include a protein food with each meal to help reach goal of 8 oz. per day   Personal Goal #3 To use some of the Ms. DASH seasonings.   Intervention Plan   Intervention Prescribe, educate  and counsel regarding individualized specific dietary modifications aiming towards targeted core components such as weight, hypertension, lipid management, diabetes, heart failure and other comorbidities.;Nutrition handout(s) given to patient.   Expected Outcomes Short Term Goal: Understand basic principles of dietary content, such as calories, fat, sodium, cholesterol and nutrients.;Short Term Goal: A plan has been developed with personal nutrition goals set during dietitian appointment.;Long Term Goal: Adherence to prescribed nutrition plan.      Nutrition Discharge:     Nutrition Assessments - 07/08/15 1200    Rate Your Plate Scores   Pre Score 61   Pre Score % 68 %   Post Score 57   Post Score % 61 %   % Change -7 %      Education Questionnaire Score:     Knowledge Questionnaire Score - 07/08/15 1201    Knowledge Questionnaire Score   Pre Score 21/28   Post Score 24/28      Goals reviewed with patient; copy given to patient.

## 2015-07-24 NOTE — Progress Notes (Signed)
Cardiac Individual Treatment Plan  Patient Details  Name: Luke Gonzales MRN: [id number] Date of Birth: November 24, 1940 Referring Provider:    Initial Encounter Date:       Cardiac Rehab from 05/21/2015 in [facility] Cardiac and Pulmonary Rehab   Date  05/21/15      Visit Diagnosis: H/O aortic valve replacement  Patient's Home Medications on Admission:  Current outpatient prescriptions:  .  allopurinol (ZYLOPRIM) 300 MG tablet, Take 300 mg by mouth daily., Disp: , Rfl:  .  amiodarone (PACERONE) 200 MG tablet, Take 200 mg by mouth 2 (two) times daily. Reported on 06/07/2015, Disp: , Rfl:  .  aspirin 81 MG chewable tablet, Chew 81 mg by mouth at bedtime. Reported on 05/21/2015, Disp: , Rfl:  .  atorvastatin (LIPITOR) 40 MG tablet, Take 40 mg by mouth every evening. , Disp: , Rfl:  .  cetirizine (ZYRTEC) 10 MG tablet, Take 10 mg by mouth daily., Disp: , Rfl:  .  metoprolol (LOPRESSOR) 50 MG tablet, Take 50 mg by mouth 2 (two) times daily., Disp: , Rfl:  .  niacin (NIASPAN) 1000 MG CR tablet, Take 1,000 mg by mouth at bedtime., Disp: , Rfl:  .  oxyCODONE (OXY IR/ROXICODONE) 5 MG immediate release tablet, Take 5 mg by mouth every 6 (six) hours as needed for severe pain., Disp: , Rfl:  .  warfarin (COUMADIN) 5 MG tablet, Take 5 mg by mouth every evening. Reported on 05/21/2015, Disp: , Rfl:   Past Medical History: Past Medical History  Diagnosis Date  . Hypertension   . Shortness of breath dyspnea   . Coronary artery disease   . Aortic stenosis s/p bicupspid valve  . BPH (benign prostatic hypertrophy)   . Gout   . Hyperlipemia     Tobacco Use: History  Smoking status  . Former Smoker -- 15 years  . Types: Cigarettes  Smokeless tobacco  . Not on file    Labs: Recent Review Flowsheet Data    There is no flowsheet data to display.       Exercise Target Goals:    Exercise Program Goal: Individual exercise prescription set with THRR, safety & activity barriers. Participant  demonstrates ability to understand and report RPE using BORG scale, to self-measure pulse accurately, and to acknowledge the importance of the exercise prescription.  Exercise Prescription Goal: Starting with aerobic activity 30 plus minutes a day, 3 days per week for initial exercise prescription. Provide home exercise prescription and guidelines that participant acknowledges understanding prior to discharge.  Activity Barriers & Risk Stratification:     Activity Barriers & Cardiac Risk Stratification - 05/21/15 1327    Activity Barriers & Cardiac Risk Stratification   Activity Barriers Joint Problems  Right shoulder arthritis.  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 HR 92 bpm  70 bpm   Max Ex. BP 162/78 mmHg  126/60 mmHg   2 Minute Post BP  130/66 mmHg    Interval Oxygen   Interval Oxygen? Yes     Baseline Oxygen Saturation % 97 %     6 Minute Oxygen Saturation % 95 %        Initial Exercise Prescription:     Initial Exercise Prescription - 05/21/15 1400    Date of Initial Exercise RX and Referring Provider   Date 05/21/15   Treadmill   MPH 2   Grade 0   Minutes 15   Recumbant Bike   Level 2   Watts 20   Minutes 15   NuStep   Level 2   Watts 40   Minutes 15   Recumbant Elliptical   Level 2   Watts 20   Minutes 15   REL-XR   Level 2   Watts 50   Minutes 15   T5 Nustep   Level 1   Watts 50   Minutes 15   Biostep-RELP   Level 1   Watts 15   Prescription Details   Frequency (times per week) 3   Duration Progress to 50  minutes of aerobic without signs/symptoms of physical distress   Intensity   THRR REST +  30   Ratings of Perceived Exertion 11-15   Perceived Dyspnea 0-4   Progression   Progression Continue to progress workloads to maintain intensity without signs/symptoms of physical distress.   Resistance Training   Training Prescription Yes   Weight 2   Reps 10-15      Perform Capillary Blood Glucose checks as needed.  Exercise Prescription Changes:     Exercise Prescription Changes      05/21/15 1400 06/12/15 0600 07/02/15 1500 07/17/15 1400     Exercise Review   Progression  Yes Yes Yes    Response to Exercise   Blood Pressure (Admit) 138/74 mmHg 148/80 mmHg 122/70 mmHg 122/66 mmHg    Blood Pressure (Exercise) 162/78 mmHg 142/82 mmHg 140/56 mmHg 136/60 mmHg    Blood Pressure (Exit) 130/66 mmHg 126/64 mmHg 118/64 mmHg 124/64 mmHg    Heart Rate (Admit) 67 bpm 63 bpm 59 bpm 59 bpm    Heart Rate (Exercise) 92 bpm 78 bpm 75 bpm 70 bpm    Heart Rate (Exit) 73 bpm 68 bpm 63 bpm 63 bpm    Perceived Dyspnea (Exercise) '12 12 12 12    '$ Symptoms  none none none    Duration  Progress to 45 minutes of aerobic exercise without signs/symptoms of physical distress Progress to 30 minutes of continuous aerobic without signs/symptoms of physical distress Progress to 45 minutes of aerobic exercise without signs/symptoms of physical distress    Intensity  Rest + 30 THRR New  99-130 THRR unchanged    Progression   Progression  Continue progressive overload as per policy without signs/symptoms or physical distress. Continue to progress workloads to maintain intensity without signs/symptoms of physical distress. Continue to progress workloads to maintain intensity without signs/symptoms of physical distress.    Average METs   3.98 3.85    Resistance Training   Training Prescription  Yes Yes Yes    Weight  '4 4 4    '$ Reps  10-15 10-15 10-15    Interval Training   Interval Training  Yes No  Has not been doing  intervals on treadmill  No    Equipment  Treadmill      Comments  2 minX30 sec intervals.Speed between 2.3/1% and 2.5/2%. Ranging 12-15 RPE. Total 20 min on TM.       Treadmill   MPH  2.5  interval training: see above comment 2.5 2.3    Grade  2 3 6.5    Minutes  '20 15 20    '$ Recumbant Bike   Level  --      Watts  --      Minutes  --      NuStep   Level  '4 6 7    '$ Watts  40 70 80    Minutes  '20 20 20    '$ Recumbant Elliptical   Level  --      Watts  --      Minutes  --      REL-XR   Level  -- 6 6    Watts  -- 69 69    Minutes  -- 20 20    T5 Nustep   Level  --      Watts  --      Minutes  --      Biostep-RELP   Level  '3 7 7    '$ Watts  25 50 55    Home Exercise Plan   Plans to continue exercise at    [facility]       Exercise Comments:     Exercise Comments      05/27/15 0857 06/10/15 1206 06/12/15 0626 07/02/15 1525 07/17/15 1019   Exercise Comments Today was the patient's first day of class. The patient's initial exercise prescription (based on the 6 min walk evaluation) was reviewed with the patient. Luke Gonzales stated that he is starting to feel stronger since starting cardiac rehab class. He stated that his doctor had told him 8 weeks after surgery he could start doing more. This 8 week post surgery time frame starts tomorrow 06/11/15. He said that he feels like he wants to start trying more daily activities and it was suggested that he try one new activitiy at a time and slowly progress as tolerated. 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  Reviewed individualized exercise prescription and made increases per departmental policy. Exercise increases were discussed with the patient and they were able to perform the new work loads without issue (no signs or symptoms).  The  recommendations and benefits of interval training were reviewed with the patient.  Luke Gonzales is making good progress with exercise.  He has not been doing the intervals on treadmill, will discuss with him at next session.  Pt will be encouraged to continue to increase workloads. "Luke Gonzales' only has a few more sessions of Cardiac Rehab. He said after his camping vacation to [facility] the end of June, he will decide about his future exercise program..      07/17/15 1020 07/17/15 1436         Exercise Comments Luke Gonzales has Silver Sneaker so can exercise for free in the [employer] (used to be called the [employer].  He has been doing great with exercise and making good progress.  We will continue to encourage him until he finishes.  Discharge Exercise Prescription (Final Exercise Prescription Changes):     Exercise Prescription Changes - 07/17/15 1400    Exercise Review   Progression Yes   Response to Exercise   Blood Pressure (Admit) 122/66 mmHg   Blood Pressure (Exercise) 136/60 mmHg   Blood Pressure (Exit) 124/64 mmHg   Heart Rate (Admit) 59 bpm   Heart Rate (Exercise) 70 bpm   Heart Rate (Exit) 63 bpm   Perceived Dyspnea (Exercise) 12   Symptoms none   Duration Progress to 45 minutes of aerobic exercise without signs/symptoms of physical distress   Intensity THRR unchanged   Progression   Progression Continue to progress workloads to maintain intensity without signs/symptoms of physical distress.   Average METs 3.85   Resistance Training   Training Prescription Yes   Weight 4   Reps 10-15   Interval Training   Interval Training No   Treadmill   MPH 2.3   Grade 6.5   Minutes 20   NuStep   Level 7   Watts 80   Minutes 20   REL-XR   Level 6   Watts 69   Minutes 20   Biostep-RELP   Level 7   [place] to continue exercise at [facility]      Nutrition:  Target Goals: Understanding of nutrition guidelines, daily  intake of sodium '1500mg'$ , cholesterol '200mg'$ , calories 30% from fat and 7% or less from saturated fats, daily to have 5 or more servings of fruits and vegetables.  Biometrics:     Pre Biometrics - 05/21/15 1452    Pre Biometrics   Height '5\' 10"'$  (1.778 m)   Weight 177 lb 9.6 oz (80.559 kg)   Waist Circumference 39.5 inches   Hip Circumference 39.5 inches   Waist to Hip Ratio 1 %   BMI (Calculated) 25.5       Nutrition Therapy Plan and Nutrition Goals:     Nutrition Therapy & Goals - 06/03/15 0835    Nutrition Therapy   Diet Instructed on a heart healthy meal plan based on 2000 calories including DASH diet principles.   Drug/Food Interactions Statins/Certain Fruits   Protein (specify units) 8   Fiber 30 grams   Whole Grain Foods 3 servings   Saturated Fats 13 max. grams   Fruits and Vegetables 5 servings/day   Sodium 2000 grams   Personal Nutrition Goals   Personal Goal #1 Try some trans free margarines such as "I Can't Believe it's not butter" or Smart Balance   Personal Goal #2 Include a protein food with each meal to help reach goal of 8 oz. per day   Personal Goal #3 To use some of the Ms. DASH seasonings.   Intervention Plan   Intervention Prescribe, educate and counsel regarding individualized specific dietary modifications aiming towards targeted core components such as weight, hypertension, lipid management, diabetes, heart failure and other comorbidities.;Nutrition handout(s) given to patient.   Expected Outcomes Short Term Goal: Understand basic principles of dietary content, such as calories, fat, sodium, cholesterol and nutrients.;Short Term Goal: A plan has been developed with personal nutrition goals set during dietitian appointment.;Long Term Goal: Adherence to prescribed nutrition plan.      Nutrition Discharge: Rate Your Plate Scores:     Nutrition Assessments - 07/08/15 1200    Rate Your Plate Scores   Pre Score 61   Pre Score % 68 %   Post Score 57    Post  Score % 61 %   % Change -7 %      Nutrition Goals Re-Evaluation:     Nutrition Goals Re-Evaluation      06/07/15 0921           Personal Goal #1 Re-Evaluation   Goal Progress Seen No       Comments Luke Gonzales said he doesn't eat much margaine       Personal Goal #2 Re-Evaluation   Goal Progress Seen Yes       Comments "My appeptite is getting better but still now what it was .        Personal Goal #3 Re-Evaluation   Goal Progress Seen No          Psychosocial: Target Goals: Acknowledge presence or absence of depression, maximize coping skills, provide positive support system. Participant is able to verbalize types and ability to use techniques and skills needed for reducing stress and depression.  Initial Review & Psychosocial Screening:     Initial Psych Review & Screening - 05/21/15 1334    Family Dynamics   Good Support System? Yes   Barriers   Psychosocial barriers to participate in program There are no identifiable barriers or psychosocial needs.;The patient should benefit from training in stress management and relaxation.   Screening Interventions   Interventions Encouraged to exercise      Quality of Life Scores:     Quality of Life - 07/08/15 1201    Quality of Life Scores   Health/Function Pre 23.5 %   Health/Function Post 24.6 %   Health/Function % Change 4.68 %   Socioeconomic Pre 23.33 %   Socioeconomic Post 24.43 %   Socioeconomic % Change  4.71 %   Psych/Spiritual Pre 22.5 %   Psych/Spiritual Post 25.07 %   Psych/Spiritual % Change 11.42 %   Family Pre 25.9 %   Family Post 22.8 %   Family % Change -11.97 %   GLOBAL Pre 23.63 %   GLOBAL Post 24.4 %   GLOBAL % Change 3.26 %      PHQ-9:     Recent Review Flowsheet Data    Depression screen [facility] 2/9 05/21/2015   Decreased Interest 0   Down, Depressed, Hopeless 0   PHQ - 2 Score 0   Altered sleeping 0   Tired, decreased energy 1   Change in appetite 2    Feeling bad or failure about  yourself  0   Trouble concentrating 0   Moving slowly or fidgety/restless 0   Suicidal thoughts 0   PHQ-9 Score 3   Difficult doing work/chores Not difficult at all      Psychosocial Evaluation and Intervention:     Psychosocial Evaluation - 05/27/15 0957    Psychosocial Evaluation & Interventions   Interventions Encouraged to exercise with the program and follow exercise prescription   Comments Counselor met with Luke Gonzales today for initial psychosocial evaluation.  He is a 75 year old who had open heart surgery in early March for aortic valve replacement.  He has a strong support system with a spouse of 25 years and several adult children who live close by.  Luke Gonzales is also actively involved in his local church.  He denies a history of depression or anxiety or current symptoms.  He reports to sleeping well and having a decreased appetite since the surgery.  Luke Gonzales states he is typically in a positive mood.  He has goals to increase his stamina and  strength while in this program.        Psychosocial Re-Evaluation:     Psychosocial Re-Evaluation      07/12/15 0933 07/17/15 0930         Psychosocial Re-Evaluation   Interventions Encouraged to attend Cardiac Rehabilitation for the exercise       Comments Luke Gonzales said he saw that throat doctor yesterday who told him that his voice quality may return but may not. I  suggested his ask his MD if Speech Therapy might help. 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 He is almost done with Cardiac Rehab. I checked na dhe does have Silver Sneakers so I showed him a Silver Sneakers exercise class that was going on this am and the [employer]. He said his wife probably has Silver Sneakers too and he will let her know.  Counselor follow up with Luke Gonzales today reporting he is feeling much stronger and able to do more normal activities now since beginning this program.  He states he continues to sleep well; has minimal stress and is generally positive  most of the time.  Luke Gonzales plans to look into the Silver Sneakers program here at [facility] or a [employer] where he and his wife can continue exercising consistently.           Vocational Rehabilitation: Provide vocational rehab assistance to qualifying candidates.   Vocational Rehab Evaluation & Intervention:     Vocational Rehab - 05/21/15 1323    Initial Vocational Rehab Evaluation & Intervention   Assessment shows need for Vocational Rehabilitation No      Education: Education Goals: Education classes will be provided on a weekly basis, covering required topics. Participant will state understanding/return demonstration of topics presented.  Learning Barriers/Preferences:     Learning Barriers/Preferences - 05/21/15 1323    Learning Barriers/Preferences   Learning Barriers Hearing   Learning Preferences Individual Instruction      Education Topics: General Nutrition Guidelines/Fats and Fiber: -Group instruction provided by verbal, written material, models and posters to present the general guidelines for heart healthy nutrition. Gives an explanation and review of dietary fats and fiber.          Cardiac Rehab from 07/22/2015 in [facility] Cardiac and Pulmonary Rehab   Date  07/08/15   Educator  Jaclyn Shaggy   Instruction Review Code  2- meets goals/outcomes      Controlling Sodium/Reading Food Labels: -Group verbal and written material supporting the discussion of sodium use in heart healthy nutrition. Review and explanation with models, verbal and written materials for utilization of the food label.      Cardiac Rehab from 07/22/2015 in [facility] Cardiac and Pulmonary Rehab   Date  07/15/15   Educator  CR   Instruction Review Code  2- meets goals/outcomes      Exercise Physiology & Risk Factors: - Group verbal and written instruction with models to review the exercise physiology of the cardiovascular system and associated critical values. Details cardiovascular disease risk  factors and the goals associated with each risk factor.      Cardiac Rehab from 07/22/2015 in [facility] Cardiac and Pulmonary Rehab   Date  07/17/15   Educator  [facility]   Instruction Review Code  2- meets goals/outcomes      Aerobic Exercise & Resistance Training: - Gives group verbal and written discussion on the health impact of inactivity. On the components of aerobic and resistive training programs and the benefits of this training and how to safely progress  through these programs.      Cardiac Rehab from 07/22/2015 in [facility] Cardiac and Pulmonary Rehab   Date  07/22/15   Educator  [facility]   Instruction Review Code  R- Review/reinforce [Second Class]      Flexibility, Balance, General Exercise Guidelines: - Provides group verbal and written instruction on the benefits of flexibility and balance training programs. Provides general exercise guidelines with specific guidelines to those with heart or lung disease. Demonstration and skill practice provided.      Cardiac Rehab from 07/22/2015 in [facility] Cardiac and Pulmonary Rehab   Date  05/29/15   Educator  bs   Instruction Review Code  2- meets goals/outcomes      Stress Management: - Provides group verbal and written instruction about the health risks of elevated stress, cause of high stress, and healthy ways to reduce stress.      Cardiac Rehab from 07/22/2015 in [facility] Cardiac and Pulmonary Rehab   Date  06/05/15   Educator  [facility]   Instruction Review Code  2- meets goals/outcomes      Depression: - Provides group verbal and written instruction on the correlation between heart/lung disease and depressed mood, treatment options, and the stigmas associated with seeking treatment.      Cardiac Rehab from 07/22/2015 in [facility] Cardiac and Pulmonary Rehab   Date  07/03/15   Educator  [facility]   Instruction Review Code  2- meets goals/outcomes      Anatomy & Physiology of the Heart: - Group verbal and written instruction and models provide basic cardiac anatomy  and physiology, with the coronary electrical and arterial systems. Review of: AMI, Angina, Valve disease, Heart Failure, Cardiac Arrhythmia, Pacemakers, and the ICD.      Cardiac Rehab from 07/22/2015 in [facility] Cardiac and Pulmonary Rehab   Date  06/03/15   Educator  Sb   Instruction Review Code  2- meets goals/outcomes      Cardiac Procedures: - Group verbal and written instruction and models to describe the testing methods done to diagnose heart disease. Reviews the outcomes of the test results. Describes the treatment choices: Medical Management, Angioplasty, or Coronary Bypass Surgery.      Cardiac Rehab from 07/22/2015 in [facility] Cardiac and Pulmonary Rehab   Date  06/10/15   Educator  CE   Instruction Review Code  2- meets goals/outcomes      Cardiac Medications: - Group verbal and written instruction to review commonly prescribed medications for heart disease. Reviews the medication, class of the drug, and side effects. Includes the steps to properly store meds and maintain the prescription regimen.      Cardiac Rehab from 07/22/2015 in [facility] Cardiac and Pulmonary Rehab   Date  06/17/15   Educator  SB   Instruction Review Code  2- meets goals/outcomes      Go Sex-Intimacy & Heart Disease, Get SMART - Goal Setting: - Group verbal and written instruction through game format to discuss heart disease and the return to sexual intimacy. Provides group verbal and written material to discuss and apply goal setting through the application of the S.M.A.R.T. Method.      Cardiac Rehab from 07/22/2015 in [facility] Cardiac and Pulmonary Rehab   Date  06/10/15   Educator  CE   Instruction Review Code  2- meets goals/outcomes      Other Matters of the Heart: - Provides group verbal, written materials and models to describe Heart Failure, Angina, Valve Disease, and Diabetes in the realm of  heart disease. Includes description of the disease process and treatment options available to the cardiac patient.       Cardiac Rehab from 07/22/2015 in [facility] Cardiac and Pulmonary Rehab   Date  06/03/15   Educator  SB   Instruction Review Code  2- meets goals/outcomes      Exercise & Equipment Safety: - Individual verbal instruction and demonstration of equipment use and safety with use of the equipment.      Cardiac Rehab from 07/22/2015 in [facility] Cardiac and Pulmonary Rehab   Date  05/21/15   Educator  SB   Instruction Review Code  2- meets goals/outcomes      Infection Prevention: - Provides verbal and written material to individual with discussion of infection control including proper hand washing and proper equipment cleaning during exercise session.      Cardiac Rehab from 07/22/2015 in [facility] Cardiac and Pulmonary Rehab   Date  05/21/15   Educator  SB   Instruction Review Code  2- meets goals/outcomes      Falls Prevention: - Provides verbal and written material to individual with discussion of falls prevention and safety.      Cardiac Rehab from 07/22/2015 in [facility] Cardiac and Pulmonary Rehab   Date  05/21/15   Educator  SB   Instruction Review Code  2- meets goals/outcomes      Diabetes: - Individual verbal and written instruction to review signs/symptoms of diabetes, desired ranges of glucose level fasting, after meals and with exercise. Advice that pre and post exercise glucose checks will be done for 3 sessions at entry of program.    Knowledge Questionnaire Score:     Knowledge Questionnaire Score - 07/08/15 1201    Knowledge Questionnaire Score   Pre Score 21/28   Post Score 24/28      Core Components/Risk Factors/Patient Goals at Admission:     Personal Goals and Risk Factors at Admission - 05/21/15 1334    Core Components/Risk Factors/Patient Goals on Admission   Sedentary Yes   Intervention Provide advice, education, support and counseling about physical activity/exercise needs.;Develop an individualized exercise prescription for aerobic and resistive training based  on initial evaluation findings, risk stratification, comorbidities and participant's personal goals.   Expected Outcomes Achievement of increased cardiorespiratory fitness and enhanced flexibility, muscular endurance and strength shown through measurements of functional capacity and personal statement of participant.   Increase Strength and Stamina Yes   Intervention Provide advice, education, support and counseling about physical activity/exercise needs.;Develop an individualized exercise prescription for aerobic and resistive training based on initial evaluation findings, risk stratification, comorbidities and participant's personal goals.   Expected Outcomes Achievement of increased cardiorespiratory fitness and enhanced flexibility, muscular endurance and strength shown through measurements of functional capacity and personal statement of participant.   Lipids Yes   Intervention Provide education and support for participant on nutrition & aerobic/resistive exercise along with prescribed medications to achieve LDL '70mg'$ , HDL >'40mg'$ .   Expected Outcomes Short Term: Participant states understanding of desired cholesterol values and is compliant with medications prescribed. Participant is following exercise prescription and nutrition guidelines.;Long Term: Cholesterol controlled with medications as prescribed, with individualized exercise RX and with personalized nutrition plan. Value goals: LDL < '70mg'$ , HDL > 40 mg.      Core Components/Risk Factors/Patient Goals Review:      Goals and Risk Factor Review      06/07/15 1003 07/12/15 0926 07/12/15 0935       Core Components/Risk Factors/Patient Goals Review  Personal Goals Review Weight Management/Obesity;Sedentary Sedentary;Weight Management/Obesity      Review Luke Gonzales lost 15 lbs with his last surgery and his appetite is not totally back. His weight lifting restriction is off so he will cont to [place] two 4 lb weights. He is doing more at home. Luke Gonzales  said he met with the Cardiac Rehab Registered Dietician and said he has already been eating healthy.  Luke Gonzales said he saw that throat doctor yesterday who told him that his voice quality may return but may not. I  suggested his ask his MD if Speech Therapy might help. 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 He is almost done with Cardiac Rehab. I checked na dhe does have Silver Sneakers so I showed him a Silver Sneakers exercise class that was going on this am and the [employer]. He said his wife probably has Silver Sneakers too and he will let her know.       Expected Outcomes Cont to exercise in Cardiac Rehab to compelte 36 sessions.   Possibly exercise in [employer].         Core Components/Risk Factors/Patient Goals at Discharge (Final Review):      Goals and Risk Factor Review - 07/12/15 0935    Core Components/Risk Factors/Patient Goals Review   Expected Outcomes Possibly exercise in [employer].       ITP Comments:     ITP Comments      05/21/15 1459 05/26/15 0947 06/07/15 0915 06/23/15 1246 07/17/15 1018   ITP Comments medical review completed   Initial ITP  Continue with ITP 30 day review.  Continue with ITP  New to program Deaire said he met with the Cardiac Rehab Registered Dietician and she said his appetite may not come back for a couple of weeks still. He lost 15 lbs with his past surgery. Dvonte "Luke Gonzales" said his wife cooks for him and he is not sure if she has tried Mrs. Dash yet. He said that he doesn't eat much margarine so he has not tried to use Promise or other products yet but he has eaten farily healthy. Woody said MD took him off his weight lifting resistriction but he still wanted to use two 4 lbs weiht today during Cardiac Rehab Resistive training portion.  30 day review. Continue with ITP "Luke Gonzales' only has a few more sessions of Cardiac Rehab. He said after his camping vacation to [facility] the end of June, he will decide about his future exercise program..      07/24/15 0726            ITP Comments 30 day review.  Continue with ITP          Comments:

## 2015-10-16 DIAGNOSIS — Z23 Encounter for immunization: Secondary | ICD-10-CM | POA: Diagnosis not present

## 2015-10-22 DIAGNOSIS — F458 Other somatoform disorders: Secondary | ICD-10-CM | POA: Diagnosis not present

## 2015-10-22 DIAGNOSIS — J3801 Paralysis of vocal cords and larynx, unilateral: Secondary | ICD-10-CM | POA: Diagnosis not present

## 2015-10-22 DIAGNOSIS — K219 Gastro-esophageal reflux disease without esophagitis: Secondary | ICD-10-CM | POA: Diagnosis not present

## 2015-10-30 DIAGNOSIS — I10 Essential (primary) hypertension: Secondary | ICD-10-CM | POA: Diagnosis not present

## 2015-10-30 DIAGNOSIS — E782 Mixed hyperlipidemia: Secondary | ICD-10-CM | POA: Diagnosis not present

## 2015-10-30 DIAGNOSIS — D62 Acute posthemorrhagic anemia: Secondary | ICD-10-CM | POA: Diagnosis not present

## 2015-11-05 DIAGNOSIS — R05 Cough: Secondary | ICD-10-CM | POA: Diagnosis not present

## 2015-11-05 DIAGNOSIS — I10 Essential (primary) hypertension: Secondary | ICD-10-CM | POA: Diagnosis not present

## 2015-11-05 DIAGNOSIS — Q251 Coarctation of aorta: Secondary | ICD-10-CM | POA: Diagnosis not present

## 2015-11-05 DIAGNOSIS — E782 Mixed hyperlipidemia: Secondary | ICD-10-CM | POA: Diagnosis not present

## 2016-02-17 DIAGNOSIS — I10 Essential (primary) hypertension: Secondary | ICD-10-CM | POA: Diagnosis not present

## 2016-02-17 DIAGNOSIS — D62 Acute posthemorrhagic anemia: Secondary | ICD-10-CM | POA: Diagnosis not present

## 2016-02-17 DIAGNOSIS — E782 Mixed hyperlipidemia: Secondary | ICD-10-CM | POA: Diagnosis not present

## 2016-02-24 DIAGNOSIS — E782 Mixed hyperlipidemia: Secondary | ICD-10-CM | POA: Diagnosis not present

## 2016-02-24 DIAGNOSIS — I10 Essential (primary) hypertension: Secondary | ICD-10-CM | POA: Diagnosis not present

## 2016-02-24 DIAGNOSIS — Q251 Coarctation of aorta: Secondary | ICD-10-CM | POA: Diagnosis not present

## 2016-02-24 DIAGNOSIS — Z0000 Encounter for general adult medical examination without abnormal findings: Secondary | ICD-10-CM | POA: Diagnosis not present

## 2016-02-24 DIAGNOSIS — M79671 Pain in right foot: Secondary | ICD-10-CM | POA: Diagnosis not present

## 2016-02-24 DIAGNOSIS — M79672 Pain in left foot: Secondary | ICD-10-CM | POA: Diagnosis not present

## 2016-03-05 DIAGNOSIS — M2041 Other hammer toe(s) (acquired), right foot: Secondary | ICD-10-CM | POA: Diagnosis not present

## 2016-03-05 DIAGNOSIS — M2012 Hallux valgus (acquired), left foot: Secondary | ICD-10-CM | POA: Diagnosis not present

## 2016-03-05 DIAGNOSIS — M79671 Pain in right foot: Secondary | ICD-10-CM | POA: Diagnosis not present

## 2016-03-05 DIAGNOSIS — M2042 Other hammer toe(s) (acquired), left foot: Secondary | ICD-10-CM | POA: Diagnosis not present

## 2016-03-05 DIAGNOSIS — M79672 Pain in left foot: Secondary | ICD-10-CM | POA: Diagnosis not present

## 2016-03-30 ENCOUNTER — Ambulatory Visit
Admission: EM | Admit: 2016-03-30 | Discharge: 2016-03-30 | Disposition: A | Discharge status: Home / Self Care | Payer: Medicare Other | Attending: Family Medicine | Admitting: Family Medicine

## 2016-03-30 ENCOUNTER — Ambulatory Visit: Payer: Medicare Other

## 2016-03-30 ENCOUNTER — Encounter: Payer: Self-pay | Admitting: *Deleted

## 2016-03-30 DIAGNOSIS — N400 Enlarged prostate without lower urinary tract symptoms: Secondary | ICD-10-CM | POA: Diagnosis not present

## 2016-03-30 DIAGNOSIS — Z79899 Other long term (current) drug therapy: Secondary | ICD-10-CM | POA: Insufficient documentation

## 2016-03-30 DIAGNOSIS — Z87891 Personal history of nicotine dependence: Secondary | ICD-10-CM | POA: Insufficient documentation

## 2016-03-30 DIAGNOSIS — R69 Illness, unspecified: Secondary | ICD-10-CM | POA: Diagnosis not present

## 2016-03-30 DIAGNOSIS — Z7982 Long term (current) use of aspirin: Secondary | ICD-10-CM | POA: Insufficient documentation

## 2016-03-30 DIAGNOSIS — I10 Essential (primary) hypertension: Secondary | ICD-10-CM | POA: Insufficient documentation

## 2016-03-30 DIAGNOSIS — I2510 Atherosclerotic heart disease of native coronary artery without angina pectoris: Secondary | ICD-10-CM | POA: Diagnosis not present

## 2016-03-30 DIAGNOSIS — M109 Gout, unspecified: Secondary | ICD-10-CM | POA: Insufficient documentation

## 2016-03-30 DIAGNOSIS — I350 Nonrheumatic aortic (valve) stenosis: Secondary | ICD-10-CM | POA: Insufficient documentation

## 2016-03-30 DIAGNOSIS — Z7901 Long term (current) use of anticoagulants: Secondary | ICD-10-CM | POA: Insufficient documentation

## 2016-03-30 DIAGNOSIS — E785 Hyperlipidemia, unspecified: Secondary | ICD-10-CM | POA: Diagnosis not present

## 2016-03-30 DIAGNOSIS — J111 Influenza due to unidentified influenza virus with other respiratory manifestations: Secondary | ICD-10-CM

## 2016-03-30 DIAGNOSIS — R509 Fever, unspecified: Secondary | ICD-10-CM | POA: Diagnosis not present

## 2016-03-30 DIAGNOSIS — R05 Cough: Secondary | ICD-10-CM | POA: Diagnosis not present

## 2016-03-30 HISTORY — DX: Unspecified osteoarthritis, unspecified site: M19.90

## 2016-03-30 MED ORDER — ACETAMINOPHEN 500 MG PO TABS
1000.0000 mg | ORAL_TABLET | Freq: Once | ORAL | Status: AC
  Administered 2016-03-30: 1000 mg via ORAL

## 2016-03-30 MED ORDER — OSELTAMIVIR PHOSPHATE 75 MG PO CAPS: 75.0000 mg | ORAL_CAPSULE | Freq: Two times a day (BID) | ORAL | 0 refills | Status: DC

## 2016-03-30 NOTE — ED Triage Notes (Signed)
Onset of chills this am and pt states he feels "tired".

## 2016-03-30 NOTE — ED Provider Notes (Signed)
[facility]    CSN: [id number] Arrival date & time: 03/30/16  1302     History   Chief Complaint Chief Complaint  Patient presents with  . Chills  . Fatigue    HPI Luke Gonzales is a 76 y.o. male.    URI  Presenting symptoms: fever   Presenting symptoms comment:  Chills Severity:  Moderate Onset quality:  Sudden Duration:  8 hours Timing:  Constant Progression:  Worsening Chronicity:  New Relieved by:  None tried Associated symptoms: myalgias   Associated symptoms: no sinus pain and no wheezing   Risk factors: being elderly, chronic cardiac disease and sick contacts (flu contacts)   Risk factors: no diabetes mellitus, no immunosuppression, no recent illness and no recent travel     Past Medical History:  Diagnosis Date  . Aortic stenosis s/p bicupspid valve  . Arthritis   . BPH (benign prostatic hypertrophy)   . Coronary artery disease   . Gout   . Hyperlipemia   . Hypertension   . Shortness of breath dyspnea     Patient Active Problem List   Diagnosis Date Noted  . Aortic stenosis, severe 02/27/2015    Past Surgical History:  Procedure Laterality Date  . CARDIAC CATHETERIZATION N/A 02/27/2015   Procedure: Right/Left Heart Cath and Coronary Angiography;  Surgeon: Corey Skains, MD;  Location: [facility];  Service: Cardiovascular;  Laterality: N/A;       Home Medications    Prior to Admission medications   Medication Sig Start Date End Date Taking? Authorizing Provider  allopurinol (ZYLOPRIM) 300 MG tablet Take 300 mg by mouth daily.   Yes Historical Provider, MD  amiodarone (PACERONE) 200 MG tablet Take 200 mg by mouth 2 (two) times daily. Reported on 06/07/2015   Yes Historical Provider, MD  aspirin 81 MG chewable tablet Chew 81 mg by mouth at bedtime. Reported on 05/21/2015   Yes Historical Provider, MD  atorvastatin (LIPITOR) 40 MG tablet Take 40 mg by mouth every evening.    Yes Historical Provider, MD  cetirizine  (ZYRTEC) 10 MG tablet Take 10 mg by mouth daily.    Historical Provider, MD  metoprolol (LOPRESSOR) 50 MG tablet Take 50 mg by mouth 2 (two) times daily.    Historical Provider, MD  niacin (NIASPAN) 1000 MG CR tablet Take 1,000 mg by mouth at bedtime.    Historical Provider, MD  oseltamivir (TAMIFLU) 75 MG capsule Take 1 capsule (75 mg total) by mouth 2 (two) times daily. 03/30/16   Norval Gable, MD  oxyCODONE (OXY IR/ROXICODONE) 5 MG immediate release tablet Take 5 mg by mouth every 6 (six) hours as needed for severe pain.    Historical Provider, MD  warfarin (COUMADIN) 5 MG tablet Take 5 mg by mouth every evening. Reported on 05/21/2015    Historical Provider, MD    Family History History reviewed. No pertinent family history.  Social History Social History  Substance Use Topics  . Smoking status: Former Smoker    Years: 15.00    Types: Cigarettes  . Smokeless tobacco: Never Used  . Alcohol use No     Allergies   Patient has no known allergies.   Review of Systems Review of Systems  Constitutional: Positive for fever.  HENT: Negative for sinus pain.   Respiratory: Negative for wheezing.   Musculoskeletal: Positive for myalgias.     Physical Exam Triage Vital Signs ED Triage Vitals  Enc Vitals Group  BP 03/30/16 1313 (!) 149/51     Pulse Rate 03/30/16 1313 87     Resp 03/30/16 1313 16     Temp 03/30/16 1313 (!) 100.5 F (38.1 C)     Temp Source 03/30/16 1313 Oral     SpO2 03/30/16 1313 97 %     Weight 03/30/16 1315 195 lb (88.5 kg)     Height 03/30/16 1315 6' (1.829 m)     Head Circumference --      Peak Flow --      Pain Score --      Pain Loc --      Pain Edu? --      Excl. in [place]? --    No data found.   Updated Vital Signs BP (!) 149/51 (BP Location: Left Arm)   Pulse 87   Temp (!) 100.5 F (38.1 C) (Oral)   Resp 16   Ht 6' (1.829 m)   Wt 195 lb (88.5 kg)   SpO2 97%   BMI 26.45 kg/m   Visual Acuity Right Eye Distance:   Left Eye Distance:     Bilateral Distance:    Right Eye Near:   Left Eye Near:    Bilateral Near:     Physical Exam  Constitutional: He appears well-developed and well-nourished. No distress.  HENT:  Head: Normocephalic and atraumatic.  Right Ear: Tympanic membrane, external ear and ear canal normal.  Left Ear: Tympanic membrane, external ear and ear canal normal.  Nose: Nose normal.  Mouth/Throat: Uvula is midline, oropharynx is clear and moist and mucous membranes are normal. No oropharyngeal exudate or tonsillar abscesses.  Eyes: Conjunctivae and EOM are normal. Pupils are equal, round, and reactive to light. Right eye exhibits no discharge. Left eye exhibits no discharge. No scleral icterus.  Neck: Normal range of motion. Neck supple. No tracheal deviation present. No thyromegaly present.  Cardiovascular: Normal rate, regular rhythm and normal heart sounds.   Pulmonary/Chest: Effort normal. No stridor. No respiratory distress. He has no wheezes. He has rales (right base). He exhibits no tenderness.  Lymphadenopathy:    He has no cervical adenopathy.  Neurological: He is alert.  Skin: Skin is warm and dry. No rash noted. He is not diaphoretic.  Nursing note and vitals reviewed.    UC Treatments / Results  Labs (all labs ordered are listed, but only abnormal results are displayed) Labs Reviewed - No data to display  EKG  EKG Interpretation None       Radiology Dg Chest 2 View  Result Date: 03/30/2016 CLINICAL DATA:  Fever and chills began this morning. History of chronic intermittently productive cough sense cardiac surgery last year. Former smoker. EXAM: CHEST  2 VIEW COMPARISON:  None in PACs FINDINGS: The patient has undergone previous aortic valve replacement. The prosthetic valve is visible and appears to be in appropriate position radiographically. The cardiac silhouette is normal in size. The pulmonary vascularity is not engorged. There are sternal wires which appear broken in the lower  anterior chest. The lungs are well-expanded. The interstitial markings are coarse. There is no alveolar infiltrate. The mediastinum is normal in width. There is calcification in the wall of the aortic arch. There is no pleural effusion. There surgical clips that project over the high axillary region on the right. There is severe degenerative change of the right shoulder. IMPRESSION: No evidence of pneumonia. Mild there are mild chronic bronchitic changes. Previous aortic valve replacement without evidence of CHF. Thoracic aortic atherosclerosis.  Electronically Signed   By: David  Martinique M.D.   On: 03/30/2016 13:44    Procedures Procedures (including critical care time)  Medications Ordered in UC Medications  acetaminophen (TYLENOL) tablet 1,000 mg (1,000 mg Oral Given 03/30/16 1321)     Initial Impression / Assessment and Plan / UC Course  I have reviewed the triage vital signs and the nursing notes.  Pertinent labs & imaging results that were available during my care of the patient were reviewed by me and considered in my medical decision making (see chart for details).       Final Clinical Impressions(s) / UC Diagnoses   Final diagnoses:  Influenza-like illness    New Prescriptions Discharge Medication List as of 03/30/2016  2:02 PM    START taking these medications   Details  oseltamivir (TAMIFLU) 75 MG capsule Take 1 capsule (75 mg total) by mouth 2 (two) times daily., Starting Mon 03/30/2016, Normal       1. x-ray results and diagnosis reviewed with patient 2. rx as per orders above; reviewed possible side effects, interactions, risks and benefits  3. Recommend supportive treatment with rest, fluiids 4. Follow-up prn if symptoms worsen or don't improve   Norval Gable, MD 03/30/16 1432

## 2016-04-03 DIAGNOSIS — Z953 Presence of xenogenic heart valve: Secondary | ICD-10-CM | POA: Diagnosis not present

## 2016-04-03 DIAGNOSIS — Q251 Coarctation of aorta: Secondary | ICD-10-CM | POA: Diagnosis not present

## 2016-04-03 DIAGNOSIS — Z09 Encounter for follow-up examination after completed treatment for conditions other than malignant neoplasm: Secondary | ICD-10-CM | POA: Diagnosis not present

## 2016-04-03 DIAGNOSIS — Z8774 Personal history of (corrected) congenital malformations of heart and circulatory system: Secondary | ICD-10-CM | POA: Diagnosis not present

## 2016-04-03 DIAGNOSIS — Z8679 Personal history of other diseases of the circulatory system: Secondary | ICD-10-CM | POA: Diagnosis not present

## 2016-04-03 DIAGNOSIS — Z9889 Other specified postprocedural states: Secondary | ICD-10-CM | POA: Diagnosis not present

## 2016-04-21 DIAGNOSIS — R05 Cough: Secondary | ICD-10-CM | POA: Diagnosis not present

## 2016-04-21 DIAGNOSIS — K219 Gastro-esophageal reflux disease without esophagitis: Secondary | ICD-10-CM | POA: Diagnosis not present

## 2016-04-21 DIAGNOSIS — J3801 Paralysis of vocal cords and larynx, unilateral: Secondary | ICD-10-CM | POA: Diagnosis not present

## 2016-05-04 DIAGNOSIS — R42 Dizziness and giddiness: Secondary | ICD-10-CM | POA: Diagnosis not present

## 2016-05-04 DIAGNOSIS — I10 Essential (primary) hypertension: Secondary | ICD-10-CM | POA: Diagnosis not present

## 2016-05-04 DIAGNOSIS — E782 Mixed hyperlipidemia: Secondary | ICD-10-CM | POA: Diagnosis not present

## 2016-05-04 DIAGNOSIS — Q251 Coarctation of aorta: Secondary | ICD-10-CM | POA: Diagnosis not present

## 2016-05-18 DIAGNOSIS — I10 Essential (primary) hypertension: Secondary | ICD-10-CM | POA: Diagnosis not present

## 2016-05-18 DIAGNOSIS — E782 Mixed hyperlipidemia: Secondary | ICD-10-CM | POA: Diagnosis not present

## 2016-10-20 DIAGNOSIS — Z23 Encounter for immunization: Secondary | ICD-10-CM | POA: Diagnosis not present

## 2016-11-09 DIAGNOSIS — R42 Dizziness and giddiness: Secondary | ICD-10-CM | POA: Diagnosis not present

## 2016-11-09 DIAGNOSIS — I10 Essential (primary) hypertension: Secondary | ICD-10-CM | POA: Diagnosis not present

## 2016-11-09 DIAGNOSIS — E782 Mixed hyperlipidemia: Secondary | ICD-10-CM | POA: Diagnosis not present

## 2016-11-09 DIAGNOSIS — Z9889 Other specified postprocedural states: Secondary | ICD-10-CM | POA: Diagnosis not present

## 2016-11-09 DIAGNOSIS — Q251 Coarctation of aorta: Secondary | ICD-10-CM | POA: Diagnosis not present

## 2016-11-09 DIAGNOSIS — Z953 Presence of xenogenic heart valve: Secondary | ICD-10-CM | POA: Diagnosis not present

## 2016-11-09 DIAGNOSIS — Z8774 Personal history of (corrected) congenital malformations of heart and circulatory system: Secondary | ICD-10-CM | POA: Diagnosis not present

## 2016-11-09 DIAGNOSIS — Z8679 Personal history of other diseases of the circulatory system: Secondary | ICD-10-CM | POA: Diagnosis not present

## 2016-12-31 DIAGNOSIS — H348312 Tributary (branch) retinal vein occlusion, right eye, stable: Secondary | ICD-10-CM | POA: Diagnosis not present

## 2017-02-25 DIAGNOSIS — Q251 Coarctation of aorta: Secondary | ICD-10-CM | POA: Diagnosis not present

## 2017-02-25 DIAGNOSIS — E782 Mixed hyperlipidemia: Secondary | ICD-10-CM | POA: Diagnosis not present

## 2017-02-25 DIAGNOSIS — I10 Essential (primary) hypertension: Secondary | ICD-10-CM | POA: Diagnosis not present

## 2017-02-25 DIAGNOSIS — Z0000 Encounter for general adult medical examination without abnormal findings: Secondary | ICD-10-CM | POA: Diagnosis not present

## 2017-05-12 DIAGNOSIS — Z8774 Personal history of (corrected) congenital malformations of heart and circulatory system: Secondary | ICD-10-CM | POA: Diagnosis not present

## 2017-05-12 DIAGNOSIS — I10 Essential (primary) hypertension: Secondary | ICD-10-CM | POA: Diagnosis not present

## 2017-05-12 DIAGNOSIS — E782 Mixed hyperlipidemia: Secondary | ICD-10-CM | POA: Diagnosis not present

## 2017-05-12 DIAGNOSIS — Z953 Presence of xenogenic heart valve: Secondary | ICD-10-CM | POA: Diagnosis not present

## 2017-05-12 DIAGNOSIS — Q251 Coarctation of aorta: Secondary | ICD-10-CM | POA: Diagnosis not present

## 2017-05-12 DIAGNOSIS — R42 Dizziness and giddiness: Secondary | ICD-10-CM | POA: Diagnosis not present

## 2017-10-22 DIAGNOSIS — I712 Thoracic aortic aneurysm, without rupture: Secondary | ICD-10-CM | POA: Diagnosis not present

## 2017-10-22 DIAGNOSIS — Z953 Presence of xenogenic heart valve: Secondary | ICD-10-CM | POA: Diagnosis not present

## 2017-10-22 DIAGNOSIS — Z9889 Other specified postprocedural states: Secondary | ICD-10-CM | POA: Diagnosis not present

## 2017-10-22 DIAGNOSIS — Z8679 Personal history of other diseases of the circulatory system: Secondary | ICD-10-CM | POA: Diagnosis not present

## 2017-10-22 DIAGNOSIS — R490 Dysphonia: Secondary | ICD-10-CM | POA: Diagnosis not present

## 2017-10-22 DIAGNOSIS — Z8774 Personal history of (corrected) congenital malformations of heart and circulatory system: Secondary | ICD-10-CM | POA: Diagnosis not present

## 2017-10-22 DIAGNOSIS — I517 Cardiomegaly: Secondary | ICD-10-CM | POA: Diagnosis not present

## 2017-10-22 DIAGNOSIS — Q251 Coarctation of aorta: Secondary | ICD-10-CM | POA: Diagnosis not present

## 2017-10-22 DIAGNOSIS — Z48812 Encounter for surgical aftercare following surgery on the circulatory system: Secondary | ICD-10-CM | POA: Diagnosis not present

## 2017-10-22 DIAGNOSIS — Z87891 Personal history of nicotine dependence: Secondary | ICD-10-CM | POA: Diagnosis not present

## 2017-10-24 DIAGNOSIS — Z23 Encounter for immunization: Secondary | ICD-10-CM | POA: Diagnosis not present

## 2017-11-08 DIAGNOSIS — J383 Other diseases of vocal cords: Secondary | ICD-10-CM | POA: Diagnosis not present

## 2017-11-08 DIAGNOSIS — J3801 Paralysis of vocal cords and larynx, unilateral: Secondary | ICD-10-CM | POA: Diagnosis not present

## 2017-12-01 DIAGNOSIS — Z9889 Other specified postprocedural states: Secondary | ICD-10-CM | POA: Diagnosis not present

## 2017-12-01 DIAGNOSIS — I10 Essential (primary) hypertension: Secondary | ICD-10-CM | POA: Diagnosis not present

## 2017-12-01 DIAGNOSIS — E782 Mixed hyperlipidemia: Secondary | ICD-10-CM | POA: Diagnosis not present

## 2017-12-01 DIAGNOSIS — Z8679 Personal history of other diseases of the circulatory system: Secondary | ICD-10-CM | POA: Diagnosis not present

## 2017-12-06 DIAGNOSIS — J3801 Paralysis of vocal cords and larynx, unilateral: Secondary | ICD-10-CM | POA: Diagnosis not present

## 2017-12-06 DIAGNOSIS — J383 Other diseases of vocal cords: Secondary | ICD-10-CM | POA: Diagnosis not present

## 2017-12-06 DIAGNOSIS — R490 Dysphonia: Secondary | ICD-10-CM | POA: Diagnosis not present

## 2017-12-20 DIAGNOSIS — J3801 Paralysis of vocal cords and larynx, unilateral: Secondary | ICD-10-CM | POA: Diagnosis not present

## 2017-12-20 DIAGNOSIS — R490 Dysphonia: Secondary | ICD-10-CM | POA: Diagnosis not present

## 2017-12-20 DIAGNOSIS — J383 Other diseases of vocal cords: Secondary | ICD-10-CM | POA: Diagnosis not present

## 2018-01-12 DIAGNOSIS — R490 Dysphonia: Secondary | ICD-10-CM | POA: Diagnosis not present

## 2018-01-12 DIAGNOSIS — J383 Other diseases of vocal cords: Secondary | ICD-10-CM | POA: Diagnosis not present

## 2018-01-12 DIAGNOSIS — J3801 Paralysis of vocal cords and larynx, unilateral: Secondary | ICD-10-CM | POA: Diagnosis not present

## 2018-01-17 DIAGNOSIS — J383 Other diseases of vocal cords: Secondary | ICD-10-CM | POA: Diagnosis not present

## 2018-01-17 DIAGNOSIS — R490 Dysphonia: Secondary | ICD-10-CM | POA: Diagnosis not present

## 2018-01-17 DIAGNOSIS — J3801 Paralysis of vocal cords and larynx, unilateral: Secondary | ICD-10-CM | POA: Diagnosis not present

## 2018-02-01 DIAGNOSIS — J3801 Paralysis of vocal cords and larynx, unilateral: Secondary | ICD-10-CM | POA: Diagnosis not present

## 2018-02-01 DIAGNOSIS — R490 Dysphonia: Secondary | ICD-10-CM | POA: Diagnosis not present

## 2018-02-01 DIAGNOSIS — J383 Other diseases of vocal cords: Secondary | ICD-10-CM | POA: Diagnosis not present

## 2019-03-24 ENCOUNTER — Ambulatory Visit: Payer: Medicare Other

## 2019-03-24 ENCOUNTER — Other Ambulatory Visit: Payer: Self-pay

## 2019-03-24 ENCOUNTER — Encounter: Payer: Self-pay | Admitting: Emergency Medicine

## 2019-03-24 ENCOUNTER — Ambulatory Visit
Admission: EM | Admit: 2019-03-24 | Discharge: 2019-03-24 | Disposition: A | Discharge status: Home / Self Care | Payer: Medicare Other | Attending: Family Medicine | Admitting: Family Medicine

## 2019-03-24 DIAGNOSIS — S82831A Other fracture of upper and lower end of right fibula, initial encounter for closed fracture: Secondary | ICD-10-CM

## 2019-03-24 DIAGNOSIS — W1842XA Slipping, tripping and stumbling without falling due to stepping into hole or opening, initial encounter: Secondary | ICD-10-CM | POA: Diagnosis not present

## 2019-03-24 MED ORDER — ACETAMINOPHEN 500 MG PO TABS: 1000.0000 mg | ORAL_TABLET | Freq: Three times a day (TID) | ORAL | 0 refills | Status: AC

## 2019-03-24 MED ORDER — IBUPROFEN 600 MG PO TABS: 600.0000 mg | ORAL_TABLET | Freq: Four times a day (QID) | ORAL | 0 refills | Status: DC | PRN

## 2019-03-24 MED ORDER — TRAMADOL HCL 50 MG PO TABS: 50.0000 mg | ORAL_TABLET | Freq: Three times a day (TID) | ORAL | 0 refills | Status: DC | PRN

## 2019-03-24 NOTE — ED Provider Notes (Signed)
[facility]    CSN: [id number] Arrival date & time: 03/24/19  [id number]      History   Chief Complaint Chief Complaint  Patient presents with   Ankle Pain    right    HPI Luke Gonzales is a 79 y.o. male.   Luke Gonzales presents with complaints of right ankle pain after a twist and fall yesterday. Stepped on something in his shop which caused his ankle to twist and fell onto his knee. Has been able to ambulate on the foot since but with pain. Increased swelling last night. No new numbness or tingling. Hasn't taken any medications for pain. Pain worse with weight bearing. No previous known ankle or foot injury. No other previous significant orthopedic history. He is no longer on a blood thinner.    ROS per HPI, negative if not otherwise mentioned.      Past Medical History:  Diagnosis Date   Aortic stenosis s/p bicupspid valve   Arthritis    BPH (benign prostatic hypertrophy)    Coronary artery disease    Gout    Hyperlipemia    Hypertension    Shortness of breath dyspnea     Patient Active Problem List   Diagnosis Date Noted   Aortic stenosis, severe 02/27/2015    Past Surgical History:  Procedure Laterality Date   CARDIAC CATHETERIZATION N/A 02/27/2015   Procedure: Right/Left Heart Cath and Coronary Angiography;  Surgeon: Corey Skains, MD;  Location: [place] [facility];  Service: Cardiovascular;  Laterality: N/A;       Home Medications    Prior to Admission medications   Medication Sig Start Date End Date Taking? Authorizing Provider  allopurinol (ZYLOPRIM) 300 MG tablet Take 300 mg by mouth daily.   Yes [provider]  amiodarone (PACERONE) 200 MG tablet Take 200 mg by mouth 2 (two) times daily. Reported on 06/07/2015   Yes [provider]  aspirin 81 MG chewable tablet Chew 81 mg by mouth at bedtime. Reported on 05/21/2015   Yes [provider]  atorvastatin (LIPITOR) 40 MG tablet Take 40 mg by mouth  every evening.    Yes [provider]  metoprolol (LOPRESSOR) 50 MG tablet Take 50 mg by mouth 2 (two) times daily.   Yes [provider]  omeprazole (PRILOSEC) 40 MG capsule TAKE 1 CAPSULE BY MOUTH DAILY 07/22/18  Yes [provider]  acetaminophen (TYLENOL) 500 MG tablet Take 2 tablets (1,000 mg total) by mouth every 8 (eight) hours. 03/24/19   Zigmund Gottron, NP  cetirizine (ZYRTEC) 10 MG tablet Take 10 mg by mouth daily.    [provider]  ibuprofen (ADVIL) 600 MG tablet Take 1 tablet (600 mg total) by mouth every 6 (six) hours as needed. 03/24/19   Zigmund Gottron, NP  niacin (NIASPAN) 1000 MG CR tablet Take 1,000 mg by mouth at bedtime.    [provider]  oseltamivir (TAMIFLU) 75 MG capsule Take 1 capsule (75 mg total) by mouth 2 (two) times daily. 03/30/16   Norval Gable, MD  oxyCODONE (OXY IR/ROXICODONE) 5 MG immediate release tablet Take 5 mg by mouth every 6 (six) hours as needed for severe pain.    [provider]  traMADol (ULTRAM) 50 MG tablet Take 1 tablet (50 mg total) by mouth every 8 (eight) hours as needed for severe pain. 03/24/19   Augusto Gamble B, NP  warfarin (COUMADIN) 5 MG tablet Take 5 mg by mouth every  evening. Reported on 05/21/2015    [provider]    Family History History reviewed. No pertinent family history.  Social History Social History   Tobacco Use   Smoking status: Former Smoker    Years: 15.00    Types: Cigarettes   Smokeless tobacco: Never Used  Substance Use Topics   Alcohol use: No   Drug use: No     Allergies   Warfarin and related   Review of Systems Review of Systems   Physical Exam Triage Vital Signs ED Triage Vitals  Enc Vitals Group     BP 03/24/19 0944 (!) 156/60     Pulse Rate 03/24/19 0944 65     Resp 03/24/19 0944 16     Temp 03/24/19 0944 98.4 F (36.9 C)     Temp Source 03/24/19 0944 Oral     SpO2 03/24/19 0944 97 %     Weight 03/24/19 0939 185  lb (83.9 kg)     Height 03/24/19 0939 6' (1.829 m)     Head Circumference --      Peak Flow --      Pain Score 03/24/19 0939 7     Pain Loc --      Pain Edu? --      Excl. in [place]? --    No data found.  Updated Vital Signs BP (!) 156/60 (BP Location: Left Arm)    Pulse 65    Temp 98.4 F (36.9 C) (Oral)    Resp 16    Ht 6' (1.829 m)    Wt 185 lb (83.9 kg)    SpO2 97%    BMI 25.09 kg/m   Visual Acuity Right Eye Distance:   Left Eye Distance:   Bilateral Distance:    Right Eye Near:   Left Eye Near:    Bilateral Near:     Physical Exam Constitutional:      Appearance: He is well-developed.  Cardiovascular:     Rate and Rhythm: Normal rate.  Pulmonary:     Effort: Pulmonary effort is normal.  Musculoskeletal:     Right ankle: Swelling and ecchymosis present. Tenderness present over the lateral malleolus and medial malleolus. Decreased range of motion.     Right foot: Normal.     Comments: Swelling noted to right ankle with tenderness worse to medial malleolus than lateral; foot is non tender; strong pedal pulse; cap refill < 2 seconds and sensation intact ; no break in skin to foot or other wounds  Skin:    General: Skin is warm and dry.  Neurological:     Mental Status: He is alert and oriented to person, place, and time.      UC Treatments / Results  Labs (all labs ordered are listed, but only abnormal results are displayed) Labs Reviewed - No data to display  EKG   Radiology DG Ankle Complete Right  Result Date: 03/24/2019 CLINICAL DATA:  Fall yesterday with right ankle pain. EXAM: RIGHT ANKLE - COMPLETE 3+ VIEW COMPARISON:  None. FINDINGS: There is a displaced oblique fracture of the distal fibular metaphysis at and above the ankle mortise. Slight widening of the medial aspect of the ankle mortise. Small spurs over the posterior and inferior calcaneus. Mild degenerate change over the midfoot. IMPRESSION: Displaced oblique fracture of the distal fibular  metaphysis. Slight widening of the medial aspect of the ankle mortise. Electronically Signed   By: Marin Olp M.D.   On: 03/24/2019 10:36   DG  Foot Complete Right  Result Date: 03/24/2019 CLINICAL DATA:  Fall.  Pain. EXAM: RIGHT FOOT COMPLETE - 3+ VIEW COMPARISON:  Right ankle series same day. FINDINGS: Diffuse soft tissue swelling. Displaced fracture of the distal fibula noted. Tiny bony density noted adjacent to the medial malleolus, this may represent a small fracture fragment. Old healed right third metatarsal fracture noted. Diffuse degenerative change, most prominent at the first MTP joint. Small metallic wire consistent foreign body noted over the soft tissues of the plantar aspect of the distal foot at the level of the distal second/third metatarsal. Old healed right third metatarsal fracture noted. IMPRESSION: 1. Displaced fracture of the distal fibula. Tiny bony density noted adjacent to the medial malleolus, this may represent a small fracture fragment. 2. Old healed third metatarsal fracture. No evidence of acute fracture involving the foot. Diffuse degenerative change, most prominent the first MTP joint. 3. Tiny metallic foreign body noted over the plantar aspect of the foot at the level of the distal second/third metatarsal. Electronically Signed   By: Marcello Moores  Register   On: 03/24/2019 10:36    Procedures Procedures (including critical care time)  Medications Ordered in UC Medications - No data to display  Initial Impression / Assessment and Plan / UC Course  I have reviewed the triage vital signs and the nursing notes.  Pertinent labs & imaging results that were available during my care of the patient were reviewed by me and considered in my medical decision making (see chart for details).     Distal fibular fracture on xray. (radiology notes a metallic foreign body plantar aspect at distal second/third metatarsal which is not obviously identified on exam, no open wounds for  exploration, suspect chronic.) Cam walker, ace, crutches, pain management and elevation discussed. Follow up with orthopedics. Patient verbalized understanding and agreeable to plan.   Final Clinical Impressions(s) / UC Diagnoses   Final diagnoses:  Closed fracture of distal end of right fibula, unspecified fracture morphology, initial encounter     Discharge Instructions     You have a fracture of your distal fibula, your ankle.  No weight bearing to your right foot.  Use of crutches, or walker if more comfortable.  Elevate your foot to help with swelling.  Keep boot on at all times, may briefly remove to bathe but do not put weight on the foot.  Tylenol every 8 hours for pain. May supplement with ibuprofen as well, take with food.  Tramadol as needed for breakthrough pain. May cause drowsiness. Please do not take if driving or drinking alcohol.  May cause constipation.  Please call today to follow up with orthopedics next week.    ED Prescriptions    Medication Sig Dispense Auth. Provider   ibuprofen (ADVIL) 600 MG tablet Take 1 tablet (600 mg total) by mouth every 6 (six) hours as needed. 30 tablet Augusto Gamble B, NP   acetaminophen (TYLENOL) 500 MG tablet Take 2 tablets (1,000 mg total) by mouth every 8 (eight) hours. 30 tablet Augusto Gamble B, NP   traMADol (ULTRAM) 50 MG tablet Take 1 tablet (50 mg total) by mouth every 8 (eight) hours as needed for severe pain. 10 tablet Zigmund Gottron, NP     I have reviewed the PDMP during this encounter.   Zigmund Gottron, NP 03/24/19 1124

## 2019-03-24 NOTE — Discharge Instructions (Signed)
You have a fracture of your distal fibula, your ankle.  No weight bearing to your right foot.  Use of crutches, or walker if more comfortable.  Elevate your foot to help with swelling.  Keep boot on at all times, may briefly remove to bathe but do not put weight on the foot.  Tylenol every 8 hours for pain. May supplement with ibuprofen as well, take with food.  Tramadol as needed for breakthrough pain. May cause drowsiness. Please do not take if driving or drinking alcohol.  May cause constipation.  Please call today to follow up with orthopedics next week.

## 2019-03-24 NOTE — ED Triage Notes (Signed)
Patient states that he stepped wrong and fell and landed on his knees yesterday.  Patient c/o swelling and pain in his right foot and ankle.  Patient denies hitting his head.

## 2019-03-27 ENCOUNTER — Other Ambulatory Visit: Payer: Self-pay

## 2019-03-27 ENCOUNTER — Observation Stay
Admission: RE | Admit: 2019-03-27 | Discharge: 2019-03-28 | Disposition: A | Discharge status: Home / Self Care | Payer: Medicare Other | Attending: Surgery | Admitting: Surgery

## 2019-03-27 ENCOUNTER — Ambulatory Visit: Payer: Medicare Other | Admitting: Family

## 2019-03-27 ENCOUNTER — Encounter
Admission: RE | Disposition: A | Discharge status: Home / Self Care | Payer: Self-pay | Source: Home / Self Care | Attending: Surgery

## 2019-03-27 ENCOUNTER — Other Ambulatory Visit: Payer: Self-pay | Admitting: Surgery

## 2019-03-27 ENCOUNTER — Other Ambulatory Visit
Admission: RE | Admit: 2019-03-27 | Discharge: 2019-03-27 | Disposition: A | Discharge status: Home / Self Care | Payer: Medicare Other | Source: Ambulatory Visit | Attending: Surgery | Admitting: Surgery

## 2019-03-27 ENCOUNTER — Ambulatory Visit: Payer: Medicare Other

## 2019-03-27 ENCOUNTER — Encounter: Payer: Self-pay | Admitting: Surgery

## 2019-03-27 DIAGNOSIS — I2510 Atherosclerotic heart disease of native coronary artery without angina pectoris: Secondary | ICD-10-CM | POA: Diagnosis not present

## 2019-03-27 DIAGNOSIS — Z87891 Personal history of nicotine dependence: Secondary | ICD-10-CM | POA: Insufficient documentation

## 2019-03-27 DIAGNOSIS — S82891A Other fracture of right lower leg, initial encounter for closed fracture: Secondary | ICD-10-CM | POA: Diagnosis present

## 2019-03-27 DIAGNOSIS — Z79899 Other long term (current) drug therapy: Secondary | ICD-10-CM | POA: Insufficient documentation

## 2019-03-27 DIAGNOSIS — I350 Nonrheumatic aortic (valve) stenosis: Secondary | ICD-10-CM | POA: Insufficient documentation

## 2019-03-27 DIAGNOSIS — Z7901 Long term (current) use of anticoagulants: Secondary | ICD-10-CM | POA: Diagnosis not present

## 2019-03-27 DIAGNOSIS — Z7982 Long term (current) use of aspirin: Secondary | ICD-10-CM | POA: Insufficient documentation

## 2019-03-27 DIAGNOSIS — M109 Gout, unspecified: Secondary | ICD-10-CM | POA: Insufficient documentation

## 2019-03-27 DIAGNOSIS — Z419 Encounter for procedure for purposes other than remedying health state, unspecified: Secondary | ICD-10-CM

## 2019-03-27 DIAGNOSIS — X501XXA Overexertion from prolonged static or awkward postures, initial encounter: Secondary | ICD-10-CM | POA: Insufficient documentation

## 2019-03-27 DIAGNOSIS — Z952 Presence of prosthetic heart valve: Secondary | ICD-10-CM | POA: Insufficient documentation

## 2019-03-27 DIAGNOSIS — M1990 Unspecified osteoarthritis, unspecified site: Secondary | ICD-10-CM | POA: Insufficient documentation

## 2019-03-27 DIAGNOSIS — S82831A Other fracture of upper and lower end of right fibula, initial encounter for closed fracture: Principal | ICD-10-CM | POA: Insufficient documentation

## 2019-03-27 DIAGNOSIS — I10 Essential (primary) hypertension: Secondary | ICD-10-CM | POA: Insufficient documentation

## 2019-03-27 DIAGNOSIS — E785 Hyperlipidemia, unspecified: Secondary | ICD-10-CM | POA: Insufficient documentation

## 2019-03-27 HISTORY — PX: ORIF ANKLE FRACTURE: SHX5408

## 2019-03-27 LAB — CREATININE, SERUM
Creatinine, Ser: 1.15 mg/dL (ref 0.61–1.24)
GFR calc Af Amer: >60 mL/min (ref >60)
GFR calc non Af Amer: >60 mL/min (ref >60)

## 2019-03-27 LAB — RESPIRATORY PANEL BY RT PCR (FLU A&B, COVID)
Influenza A by PCR: NEGATIVE
Influenza B by PCR: NEGATIVE
SARS Coronavirus 2 by RT PCR: NEGATIVE

## 2019-03-27 SURGERY — OPEN REDUCTION INTERNAL FIXATION (ORIF) ANKLE FRACTURE
Anesthesia: Spinal | Site: Ankle | Laterality: Right

## 2019-03-27 MED ORDER — ONDANSETRON HCL 4 MG/2ML IJ SOLN: 4.0000 mg | Freq: Four times a day (QID) | INTRAMUSCULAR | Status: DC | PRN

## 2019-03-27 MED ORDER — HYDROCODONE-ACETAMINOPHEN 5-325 MG PO TABS
1.0000 | ORAL_TABLET | ORAL | Status: DC | PRN
  Administered 2019-03-27: 23:00:00 1 via ORAL
  Filled 2019-03-27: qty 1

## 2019-03-27 MED ORDER — ONDANSETRON HCL 4 MG/2ML IJ SOLN: 4.0000 mg | Freq: Once | INTRAMUSCULAR | Status: DC | PRN

## 2019-03-27 MED ORDER — FENTANYL CITRATE (PF) 100 MCG/2ML IJ SOLN: 25.0000 ug | INTRAMUSCULAR | Status: DC | PRN

## 2019-03-27 MED ORDER — ALLOPURINOL 300 MG PO TABS
300.0000 mg | ORAL_TABLET | Freq: Every day | ORAL | Status: DC
  Administered 2019-03-28: 09:00:00 300 mg via ORAL
  Filled 2019-03-27: qty 1

## 2019-03-27 MED ORDER — FLEET ENEMA 7-19 GM/118ML RE ENEM: 1.0000 | ENEMA | Freq: Once | RECTAL | Status: DC | PRN

## 2019-03-27 MED ORDER — SODIUM CHLORIDE 0.9 % IV SOLN: INTRAVENOUS | Status: DC

## 2019-03-27 MED ORDER — CHLORHEXIDINE GLUCONATE 4 % EX LIQD: 60.0000 mL | Freq: Once | CUTANEOUS | Status: DC

## 2019-03-27 MED ORDER — CEFAZOLIN SODIUM-DEXTROSE 2-4 GM/100ML-% IV SOLN
INTRAVENOUS | Status: AC
  Filled 2019-03-27: qty 100

## 2019-03-27 MED ORDER — ONDANSETRON HCL 4 MG PO TABS: 4.0000 mg | ORAL_TABLET | Freq: Four times a day (QID) | ORAL | Status: DC | PRN

## 2019-03-27 MED ORDER — ROCURONIUM BROMIDE 50 MG/5ML IV SOLN
INTRAVENOUS | Status: AC
  Filled 2019-03-27: qty 1

## 2019-03-27 MED ORDER — DOCUSATE SODIUM 100 MG PO CAPS
100.0000 mg | ORAL_CAPSULE | Freq: Two times a day (BID) | ORAL | Status: DC
  Administered 2019-03-27 – 2019-03-28 (×2): 100 mg via ORAL
  Filled 2019-03-27 (×2): qty 1

## 2019-03-27 MED ORDER — PROPOFOL 500 MG/50ML IV EMUL
INTRAVENOUS | Status: DC | PRN
  Administered 2019-03-27: 35 ug/kg/min via INTRAVENOUS

## 2019-03-27 MED ORDER — METOPROLOL TARTRATE 50 MG PO TABS
50.0000 mg | ORAL_TABLET | Freq: Two times a day (BID) | ORAL | Status: DC
  Administered 2019-03-27: 22:00:00 50 mg via ORAL
  Filled 2019-03-27 (×2): qty 1

## 2019-03-27 MED ORDER — PROPOFOL 10 MG/ML IV BOLUS
INTRAVENOUS | Status: AC
  Filled 2019-03-27: qty 40

## 2019-03-27 MED ORDER — LORATADINE 10 MG PO TABS
10.0000 mg | ORAL_TABLET | Freq: Every day | ORAL | Status: DC
  Administered 2019-03-27 – 2019-03-28 (×2): 10 mg via ORAL
  Filled 2019-03-27 (×2): qty 1

## 2019-03-27 MED ORDER — EPHEDRINE SULFATE 50 MG/ML IJ SOLN
INTRAMUSCULAR | Status: AC
  Filled 2019-03-27: qty 1

## 2019-03-27 MED ORDER — FENTANYL CITRATE (PF) 100 MCG/2ML IJ SOLN
INTRAMUSCULAR | Status: AC
  Filled 2019-03-27: qty 2

## 2019-03-27 MED ORDER — HYDROCODONE-ACETAMINOPHEN 5-325 MG PO TABS: 1.0000 | ORAL_TABLET | Freq: Four times a day (QID) | ORAL | 0 refills | Status: AC | PRN

## 2019-03-27 MED ORDER — TRAMADOL HCL 50 MG PO TABS: 50.0000 mg | ORAL_TABLET | Freq: Four times a day (QID) | ORAL | Status: DC | PRN

## 2019-03-27 MED ORDER — METOCLOPRAMIDE HCL 10 MG PO TABS: 5.0000 mg | ORAL_TABLET | Freq: Three times a day (TID) | ORAL | Status: DC | PRN

## 2019-03-27 MED ORDER — LIDOCAINE HCL (PF) 2 % IJ SOLN
INTRAMUSCULAR | Status: AC
  Filled 2019-03-27: qty 5

## 2019-03-27 MED ORDER — BISACODYL 10 MG RE SUPP: 10.0000 mg | Freq: Every day | RECTAL | Status: DC | PRN

## 2019-03-27 MED ORDER — GLYCOPYRROLATE 0.2 MG/ML IJ SOLN
INTRAMUSCULAR | Status: DC | PRN
  Administered 2019-03-27: .2 mg via INTRAVENOUS

## 2019-03-27 MED ORDER — POTASSIUM CHLORIDE IN NACL 20-0.9 MEQ/L-% IV SOLN
INTRAVENOUS | Status: DC
  Filled 2019-03-27 (×4): qty 1000

## 2019-03-27 MED ORDER — FENTANYL CITRATE (PF) 100 MCG/2ML IJ SOLN
INTRAMUSCULAR | Status: DC | PRN
  Administered 2019-03-27 (×2): 25 ug via INTRAVENOUS

## 2019-03-27 MED ORDER — BUPIVACAINE HCL (PF) 0.5 % IJ SOLN
INTRAMUSCULAR | Status: AC
  Filled 2019-03-27: qty 30

## 2019-03-27 MED ORDER — PHENYLEPHRINE HCL (PRESSORS) 10 MG/ML IV SOLN
INTRAVENOUS | Status: DC | PRN
  Administered 2019-03-27 (×5): 100 ug via INTRAVENOUS

## 2019-03-27 MED ORDER — ATORVASTATIN CALCIUM 20 MG PO TABS
40.0000 mg | ORAL_TABLET | Freq: Every evening | ORAL | Status: DC
  Administered 2019-03-27: 23:00:00 40 mg via ORAL
  Filled 2019-03-27: qty 2

## 2019-03-27 MED ORDER — HYDROCODONE-ACETAMINOPHEN 5-325 MG PO TABS: 1.0000 | ORAL_TABLET | ORAL | Status: DC | PRN

## 2019-03-27 MED ORDER — DIPHENHYDRAMINE HCL 12.5 MG/5ML PO ELIX: 12.5000 mg | ORAL_SOLUTION | ORAL | Status: DC | PRN

## 2019-03-27 MED ORDER — ONDANSETRON HCL 4 MG/2ML IJ SOLN
INTRAMUSCULAR | Status: AC
  Filled 2019-03-27: qty 2

## 2019-03-27 MED ORDER — METOCLOPRAMIDE HCL 5 MG/ML IJ SOLN: 5.0000 mg | Freq: Three times a day (TID) | INTRAMUSCULAR | Status: DC | PRN

## 2019-03-27 MED ORDER — OSELTAMIVIR PHOSPHATE 30 MG PO CAPS
30.0000 mg | ORAL_CAPSULE | Freq: Two times a day (BID) | ORAL | Status: DC
  Filled 2019-03-27 (×2): qty 1

## 2019-03-27 MED ORDER — PANTOPRAZOLE SODIUM 40 MG PO TBEC
40.0000 mg | DELAYED_RELEASE_TABLET | Freq: Every day | ORAL | Status: DC
  Administered 2019-03-28: 40 mg via ORAL
  Filled 2019-03-27: qty 1

## 2019-03-27 MED ORDER — EPHEDRINE SULFATE 50 MG/ML IJ SOLN
INTRAMUSCULAR | Status: DC | PRN
  Administered 2019-03-27 (×2): 10 mg via INTRAVENOUS

## 2019-03-27 MED ORDER — BUPIVACAINE HCL 0.5 % IJ SOLN
INTRAMUSCULAR | Status: DC | PRN
  Administered 2019-03-27: 20 mL

## 2019-03-27 MED ORDER — IBUPROFEN 400 MG PO TABS: 600.0000 mg | ORAL_TABLET | Freq: Four times a day (QID) | ORAL | Status: DC | PRN

## 2019-03-27 MED ORDER — CEFAZOLIN SODIUM-DEXTROSE 2-4 GM/100ML-% IV SOLN
2.0000 g | Freq: Four times a day (QID) | INTRAVENOUS | Status: AC
  Administered 2019-03-27 – 2019-03-28 (×3): 2 g via INTRAVENOUS
  Filled 2019-03-27 (×3): qty 100

## 2019-03-27 MED ORDER — ACETAMINOPHEN 500 MG PO TABS
500.0000 mg | ORAL_TABLET | Freq: Four times a day (QID) | ORAL | Status: DC
  Administered 2019-03-27 – 2019-03-28 (×3): 500 mg via ORAL
  Filled 2019-03-27 (×3): qty 1

## 2019-03-27 MED ORDER — ASPIRIN 81 MG PO CHEW
81.0000 mg | CHEWABLE_TABLET | Freq: Every day | ORAL | Status: DC
  Administered 2019-03-27: 22:00:00 81 mg via ORAL
  Filled 2019-03-27: qty 1

## 2019-03-27 MED ORDER — ENOXAPARIN SODIUM 40 MG/0.4ML ~~LOC~~ SOLN
40.0000 mg | SUBCUTANEOUS | Status: DC
  Administered 2019-03-28: 09:00:00 40 mg via SUBCUTANEOUS
  Filled 2019-03-27: qty 0.4

## 2019-03-27 MED ORDER — NIACIN ER (ANTIHYPERLIPIDEMIC) 500 MG PO TBCR
1000.0000 mg | EXTENDED_RELEASE_TABLET | Freq: Every day | ORAL | Status: DC
  Administered 2019-03-27: 22:00:00 1000 mg via ORAL
  Filled 2019-03-27 (×2): qty 2

## 2019-03-27 MED ORDER — LACTATED RINGERS IV SOLN: INTRAVENOUS | Status: DC

## 2019-03-27 MED ORDER — MAGNESIUM HYDROXIDE 400 MG/5ML PO SUSP: 30.0000 mL | Freq: Every day | ORAL | Status: DC | PRN

## 2019-03-27 MED ORDER — BUPIVACAINE IN DEXTROSE 0.75-8.25 % IT SOLN
INTRATHECAL | Status: DC | PRN
  Administered 2019-03-27: 2.5 mL via INTRATHECAL

## 2019-03-27 MED ORDER — CEFAZOLIN SODIUM-DEXTROSE 2-4 GM/100ML-% IV SOLN
2.0000 g | INTRAVENOUS | Status: AC
  Administered 2019-03-27: 13:00:00 2 g via INTRAVENOUS

## 2019-03-27 SURGICAL SUPPLY — 64 items
BIT DRILL 2.5X2.75 QC CALB (BIT) ×3 IMPLANT
BIT DRILL 3.5X5.5 QC CALB (BIT) ×3 IMPLANT
BIT DRILL CALIBRATED 2.7 (BIT) ×2 IMPLANT
BIT DRILL CALIBRATED 2.7MM (BIT) ×1
BLADE SURG 15 STRL LF DISP TIS (BLADE) ×1 IMPLANT
BLADE SURG 15 STRL SS (BLADE) ×2
BLADE SURG SZ10 CARB STEEL (BLADE) ×6 IMPLANT
BNDG COHESIVE 4X5 TAN STRL (GAUZE/BANDAGES/DRESSINGS) ×3 IMPLANT
BNDG ELASTIC 4X5.8 VLCR STR LF (GAUZE/BANDAGES/DRESSINGS) ×3 IMPLANT
BNDG ELASTIC 6X5.8 VLCR STR LF (GAUZE/BANDAGES/DRESSINGS) ×6 IMPLANT
BNDG ESMARK 6X12 TAN STRL LF (GAUZE/BANDAGES/DRESSINGS) ×3 IMPLANT
BNDG PLASTER FAST 4X5 WHT LF (CAST SUPPLIES) ×12 IMPLANT
CANISTER SUCT 1200ML W/VALVE (MISCELLANEOUS) ×3 IMPLANT
CHLORAPREP W/TINT 26 (MISCELLANEOUS) ×6 IMPLANT
COVER WAND RF STERILE (DRAPES) ×3 IMPLANT
CUFF TOURN SGL QUICK 18 (TOURNIQUET CUFF) ×3 IMPLANT
CUFF TOURN SGL QUICK 24 (TOURNIQUET CUFF)
CUFF TOURN SGL QUICK 30 (TOURNIQUET CUFF)
CUFF TRNQT CYL 24X4X16.5-23 (TOURNIQUET CUFF) IMPLANT
CUFF TRNQT CYL 30X4X21-28X (TOURNIQUET CUFF) IMPLANT
DRAPE C-ARM XRAY 36X54 (DRAPES) ×3 IMPLANT
DRAPE C-ARMOR (DRAPES) ×3 IMPLANT
DRAPE INCISE IOBAN 66X45 STRL (DRAPES) IMPLANT
DRAPE SPLIT 6X30 W/TAPE (DRAPES) ×3 IMPLANT
DRAPE U-SHAPE 47X51 STRL (DRAPES) ×3 IMPLANT
ELECT CAUTERY BLADE 6.4 (BLADE) ×3 IMPLANT
ELECT REM PT RETURN 9FT ADLT (ELECTROSURGICAL) ×3
ELECTRODE REM PT RTRN 9FT ADLT (ELECTROSURGICAL) ×1 IMPLANT
GAUZE SPONGE 4X4 12PLY STRL (GAUZE/BANDAGES/DRESSINGS) ×3 IMPLANT
GAUZE XEROFORM 1X8 LF (GAUZE/BANDAGES/DRESSINGS) ×3 IMPLANT
GLOVE BIO SURGEON STRL SZ8 (GLOVE) ×9 IMPLANT
GLOVE INDICATOR 8.0 STRL GRN (GLOVE) ×3 IMPLANT
GOWN STRL REUS W/ TWL LRG LVL3 (GOWN DISPOSABLE) ×1 IMPLANT
GOWN STRL REUS W/ TWL XL LVL3 (GOWN DISPOSABLE) ×1 IMPLANT
GOWN STRL REUS W/TWL LRG LVL3 (GOWN DISPOSABLE) ×2
GOWN STRL REUS W/TWL XL LVL3 (GOWN DISPOSABLE) ×2
HEMOVAC 400ML (MISCELLANEOUS) ×3
KIT DRAIN HEMOVAC JP 7FR 400ML (MISCELLANEOUS) ×1 IMPLANT
KIT TURNOVER KIT A (KITS) ×3 IMPLANT
LABEL OR SOLS (LABEL) ×3 IMPLANT
NS IRRIG 1000ML POUR BTL (IV SOLUTION) ×3 IMPLANT
PACK EXTREMITY ARMC (MISCELLANEOUS) ×3 IMPLANT
PAD ABD DERMACEA PRESS 5X9 (GAUZE/BANDAGES/DRESSINGS) ×6 IMPLANT
PAD CAST CTTN 4X4 STRL (SOFTGOODS) ×2 IMPLANT
PAD PREP 24X41 OB/GYN DISP (PERSONAL CARE ITEMS) IMPLANT
PADDING CAST COTTON 4X4 STRL (SOFTGOODS) ×4
PLATE LOCK 8H 103 BILAT FIB (Plate) ×3 IMPLANT
SCREW CORTICAL 3.5MM  20MM (Screw) ×2 IMPLANT
SCREW CORTICAL 3.5MM 18MM (Screw) ×3 IMPLANT
SCREW CORTICAL 3.5MM 20MM (Screw) ×1 IMPLANT
SCREW LOCK CORT STAR 3.5X10 (Screw) ×3 IMPLANT
SCREW LOCK CORT STAR 3.5X12 (Screw) ×3 IMPLANT
SCREW LOCK CORT STAR 3.5X14 (Screw) ×3 IMPLANT
SCREW NON LOCKING LP 3.5 14MM (Screw) ×6 IMPLANT
SCREW NON LOCKING LP 3.5 16MM (Screw) ×3 IMPLANT
SPLINT CAST 1 STEP 4X30 (MISCELLANEOUS) ×6 IMPLANT
SPONGE LAP 18X18 RF (DISPOSABLE) ×6 IMPLANT
STAPLER SKIN PROX 35W (STAPLE) ×3 IMPLANT
STOCKINETTE IMPERV 14X48 (MISCELLANEOUS) ×3 IMPLANT
STOCKINETTE IMPERVIOUS 9X36 MD (GAUZE/BANDAGES/DRESSINGS) ×3 IMPLANT
SUT VIC AB 0 CT1 36 (SUTURE) ×3 IMPLANT
SUT VIC AB 2-0 SH 27 (SUTURE) ×4
SUT VIC AB 2-0 SH 27XBRD (SUTURE) ×2 IMPLANT
SYR 10ML LL (SYRINGE) ×3 IMPLANT

## 2019-03-27 NOTE — H&P (Signed)
Paper H&P to be scanned into permanent record. H&P reviewed and patient re-examined. No changes. 

## 2019-03-27 NOTE — Progress Notes (Signed)
Wedding ring placed in bag and put in shoe

## 2019-03-27 NOTE — Discharge Instructions (Addendum)
Orthopedic discharge instructions: Keep splint dry and intact.  Keep leg elevated above heart level at all times. Apply ice frequently to ankle. Take ibuprofen 600 mg TID with meals for 7-10 days, then as necessary.            TID = three times daily (every 8 hours) Take pain medication as prescribed when needed.  May supplement with ES Tylenol if necessary. No weight-bearing on right foot - use crutches or walker for ambulation. Follow-up in 10-14 days or as scheduled. Take aspirin 325mg  daily instead of 81mg  aspirin for 14 days.   AMBULATORY SURGERY  DISCHARGE INSTRUCTIONS   1) The drugs that you were given will stay in your system until tomorrow so for the next 24 hours you should not:  A) Drive an automobile B) Make any legal decisions C) Drink any alcoholic beverage   2) You may resume regular meals tomorrow.  Today it is better to start with liquids and gradually work up to solid foods.  You may eat anything you prefer, but it is better to start with liquids, then soup and crackers, and gradually work up to solid foods.   3) Please notify your doctor immediately if you have any unusual bleeding, trouble breathing, redness and pain at the surgery site, drainage, fever, or pain not relieved by medication.    4) Additional Instructions:        Please contact your physician with any problems or Same Day Surgery at [phone], Monday through Friday 6 am to 4 pm, or [place] at [facility] number at [phone].

## 2019-03-27 NOTE — Anesthesia Procedure Notes (Addendum)
Spinal  Patient location during procedure: OR Start time: 03/27/2019 12:48 PM End time: 03/27/2019 1:04 PM Staffing Performed: anesthesiologist  Anesthesiologist: Molli Barrows, MD Resident/CRNA: Gentry Fitz, CRNA Preanesthetic Checklist Completed: patient identified, IV checked, site marked, risks and benefits discussed, surgical consent, monitors and equipment checked and pre-op evaluation Spinal Block Patient position: sitting Prep: Betadine Patient monitoring: heart rate, continuous pulse ox and blood pressure Approach: midline Location: L3-4 Injection technique: single-shot Needle Needle type: Quincke  Needle gauge: 22 G Assessment Sensory level: T8 Additional Notes Patient placed in sitting position.  Landmarks identified.  Area prepped with betadine, sterile drape applied.  Localized with 48ml of 2% lidocaine.Introducer placed to L3/4 interspace. Multiple redirects.  MD with success with 22G quinkie.  +CSF, negative heme, meg parathesia. Patient placed in supine position. T8 level noted.  Tolerated procedure well.  VSS

## 2019-03-27 NOTE — Anesthesia Preprocedure Evaluation (Addendum)
Anesthesia Evaluation   Patient awake    Reviewed: Allergy & Precautions, H&P , NPO status , Patient's Chart, lab work & pertinent test results, reviewed documented beta blocker date and time   Airway Mallampati: II  TM Distance: >3 FB Neck ROM: full    Dental  (+) Teeth Intact   Pulmonary shortness of breath, former smoker,    Pulmonary exam normal        Cardiovascular Exercise Tolerance: Poor hypertension, On Medications + CAD  Normal cardiovascular exam Rate:Normal  Status post aortic valve replacement in 2017 and doing well.  Active with no chest pain or SOB.  JA   Neuro/Psych negative neurological ROS  negative psych ROS   GI/Hepatic negative GI ROS, Neg liver ROS,   Endo/Other  negative endocrine ROS  Renal/GU negative Renal ROS  negative genitourinary   Musculoskeletal   Abdominal   Peds  Hematology negative hematology ROS (+)   Anesthesia Other Findings   Reproductive/Obstetrics negative OB ROS                            Anesthesia Physical Anesthesia Plan  ASA: III  Anesthesia Plan: Spinal   Post-op Pain Management:    Induction:   PONV Risk Score and Plan: 2  Airway Management Planned:   Additional Equipment:   Intra-op Plan:   Post-operative Plan:   Informed Consent: I have reviewed the patients History and Physical, chart, labs and discussed the procedure including the risks, benefits and alternatives for the proposed anesthesia with the patient or authorized representative who has indicated his/her understanding and acceptance.       Plan Discussed with: CRNA  Anesthesia Plan Comments:        Anesthesia Quick Evaluation

## 2019-03-27 NOTE — Progress Notes (Signed)
PHARMACY NOTE:  ANTIMICROBIAL RENAL DOSAGE ADJUSTMENT  Current antimicrobial regimen includes a mismatch between antimicrobial dosage and estimated renal function.  As per policy approved by the Pharmacy & Therapeutics and Medical Executive Committees, the antimicrobial dosage will be adjusted accordingly.  Current antimicrobial dosage:  Oseltamivir 75 mg PO BID   Indication: influenza   Renal Function:  Estimated Creatinine Clearance: 58.1 mL/min (by C-G formula based on SCr of 1.15 mg/dL). []      On intermittent HD, scheduled: []      On CRRT    Antimicrobial dosage has been changed to:  Oseltamivir 30 mg PO BID   Additional comments:   Thank you for allowing pharmacy to be a part of this patient's care.  Orene Desanctis, [facility] 03/27/2019 9:46 PM

## 2019-03-27 NOTE — Evaluation (Signed)
Physical Therapy Evaluation Patient Details Name: Luke Gonzales MRN: [id number] DOB: 06-17-1940 Today's Date: 03/27/2019   History of Present Illness  Pt is a 79 yo male diagnosed with an unstable distal fibular fracture of the right ankle s/p mechanical fall and is s/p ORIF.  PMH includes: HTN, CAD, SOB, and aortic valve replacement in 2017.    Clinical Impression  Pt pleasant and motivated to participate during the session.  Pt presented with return of sensation to light touch and proprioception to BLEs as well as functional strength but reported a subjective feeling of not being able to control his LEs.  Pt did present with some noted difficulty with smooth, controlled motions of the LEs during therex and manual muscle testing.  Pt most notably struggled with standing where he was able to come to standing with very little assistance to rise but required heavy Mod A to prevent L lateral LOB once in standing leaning heavily into this PT.  Pt given time to rest and multiple further attempts made to stand with the same result each time.  Pt would not be safe to return to his prior living situation at this time secondary to his elevated fall risk but based on his PLOF should make good progress towards goals while in acute care.  Will recommend HHPT upon discharge to address the deficits noted in the pt's problem list below but will watch carefully for possible change to SNF recommendation if coordination/stability is slow to return.       Follow Up Recommendations Home health PT;Supervision for mobility/OOB    Equipment Recommendations  Rolling walker with 5" wheels;3in1 (PT)    Recommendations for Other Services       Precautions / Restrictions Precautions Precautions: Fall Restrictions Weight Bearing Restrictions: Yes RLE Weight Bearing: Non weight bearing Other Position/Activity Restrictions: Keep O2 >92%      Mobility  Bed Mobility Overal bed mobility: Independent             General bed mobility comments: Good speed and effort with bed mobility tasks  Transfers Overall transfer level: Needs assistance Equipment used: Rolling walker (2 wheeled) Transfers: Sit to/from Stand Sit to Stand: Mod assist         General transfer comment: Sit to/from stand x 3 with min verbal and tactile cues for sequencing and heavy mod A to prevent L lateral LOB each time pt came to standing  Ambulation/Gait             General Gait Details: unable/unsafe to attempt  Stairs            Wheelchair Mobility    Modified Rankin (Stroke Patients Only)       Balance Overall balance assessment: Needs assistance Sitting-balance support: Bilateral upper extremity supported;Feet unsupported Sitting balance-Leahy Scale: Good     Standing balance support: Bilateral upper extremity supported Standing balance-Leahy Scale: Poor Standing balance comment: Left lateral lean requiring mod A to prevent LOB                             Pertinent Vitals/Pain Pain Assessment: No/denies pain    Home Living Family/patient expects to be discharged to:: Private residence Living Arrangements: Spouse/significant other Available Help at Discharge: Family;Available 24 hours/day Type of Home: House Home Access: Stairs to enter Entrance Stairs-Rails: (See comments) Entrance Stairs-Number of Steps: 4 with R rail then a deck and then 5 with L rail Home Layout: Two level;Able  to live on main level with bedroom/bathroom Home Equipment: Gilford Rile - standard;Crutches Additional Comments: Knee scooter    Prior Function Level of Independence: Independent         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, active, Ind with all ADLs, no other fall history     Hand Dominance        Extremity/Trunk Assessment   Upper Extremity Assessment Upper Extremity Assessment: Overall WFL for tasks assessed    Lower Extremity Assessment Lower Extremity Assessment:  Generalized weakness;RLE deficits/detail;LLE deficits/detail RLE Deficits / Details: Pt able to perform toe wiggles and perform Ind RLE SLR without extensor lag RLE: Unable to fully assess due to immobilization RLE Sensation: WNL LLE Deficits / Details: Pt's ankle DF/PF strength and AROM WNL, pt able to perform Ind LLE SLR without extensor lag, knee flex and ext 5/5 LLE Sensation: WNL       Communication   Communication: HOH  Cognition Arousal/Alertness: Awake/alert Behavior During Therapy: WFL for tasks assessed/performed Overall Cognitive Status: Within Functional Limits for tasks assessed                                        General Comments      Exercises Total Joint Exercises Ankle Circles/Pumps: AROM;Both;5 reps;10 reps;Other (comment)(toe wiggles on the R foot) Quad Sets: Strengthening;Both;5 reps;10 reps Gluteal Sets: Strengthening;Both;5 reps;10 reps Heel Slides: AROM;Left;10 reps Hip ABduction/ADduction: AROM;Both;5 reps Straight Leg Raises: AROM;Both;5 reps Long Arc Quad: AROM;Strengthening;Both;10 reps Knee Flexion: AROM;Strengthening;10 reps;Both Other Exercises Other Exercises: HEP education with pt and spouse for RLE toe wiggles/LLE APs, BLE QS and GS x 10 each every 1-2 hours   Assessment/Plan    PT Assessment Patient needs continued PT services  PT Problem List Decreased strength;Decreased activity tolerance;Decreased balance;Decreased mobility;Decreased knowledge of use of DME;Decreased knowledge of precautions;Decreased safety awareness       PT Treatment Interventions DME instruction;Gait training;Stair training;Functional mobility training;Therapeutic activities;Therapeutic exercise;Balance training;Patient/family education    PT Goals (Current goals can be found in the Care Plan section)  Acute Rehab PT Goals Patient Stated Goal: To get back to walking and return home PT Goal Formulation: With patient Time For Goal Achievement:  04/09/19 Potential to Achieve Goals: Good    Frequency BID   Barriers to discharge        Co-evaluation               AM-PAC PT "6 Clicks" Mobility  Outcome Measure Help needed turning from your back to your side while in a flat bed without using bedrails?: None Help needed moving from lying on your back to sitting on the side of a flat bed without using bedrails?: None Help needed moving to and from a bed to a chair (including a wheelchair)?: A Lot Help needed standing up from a chair using your arms (e.g., wheelchair or bedside chair)?: A Lot Help needed to walk in hospital room?: Total Help needed climbing 3-5 steps with a railing? : Total 6 Click Score: 14    End of Session Equipment Utilized During Treatment: Gait belt Activity Tolerance: Patient tolerated treatment well Patient left: in bed;with nursing/sitter in room;with family/visitor present(Pt as found in PACU with nsg present) Nurse Communication: Mobility status PT Visit Diagnosis: Unsteadiness on feet (R26.81);Difficulty in walking, not elsewhere classified (R26.2);Muscle weakness (generalized) (M62.81)    Time: [id number] PT Time Calculation (min) (ACUTE ONLY): 38 min   Charges:  PT Evaluation $PT Eval Moderate Complexity: 1 Mod PT Treatments $Therapeutic Exercise: 8-22 mins        D. Royetta Asal PT, DPT 03/27/19, 5:31 PM

## 2019-03-27 NOTE — Progress Notes (Signed)
Up in room. Ambulated to sink using walker and nonweightbearing on right leg. Patient is asking if he can get a rolling right knee rest for home.

## 2019-03-27 NOTE — Op Note (Signed)
03/27/2019  2:42 PM  Patient:   Luke Gonzales  Pre-Op Diagnosis:   Unstable distal fibular fracture, right ankle.  Post-Op Diagnosis:   Same.  Procedure:   Open reduction and internal fixation of unstable distal fibular fracture, right ankle.  Surgeon:   Pascal Lux, MD  Assistant:   None  Anesthesia:   Spinal  Findings:   As above.  Complications:   None  EBL:   10 cc  Fluids:   1200 cc crystalloid  UOP:   None  TT:   63 min at 250 mmHg  Drains:   None  Closure:   Staples  Implants:   Biomet ALPS 8-hole composite locking plate and screws  Brief Clinical Note:   The patient is a 79 year old male who sustained the above-noted injury 4 days ago when he sustained a twisting injury after stepping on an object on the floor of his shop. He presented to the urgent care clinic in [facility] the following day where x-rays demonstrated a distal fibular fracture with widening of the medial mortise. The patient was advised to come to the office this morning in anticipation of surgical intervention. The patient has been cleared medically and presents at this time for definitive management of his injury.  Procedure:   The patient was brought into the operating room. After adequate spinal anesthesia was obtained, the patient was lain in the supine position. The right foot and lower leg were prepped with ChloraPrep solution, then draped sterilely. Preoperative antibiotics were administered. A timeout was performed to verify the appropriate surgical site before the limb was exsanguinated with an Esmarch and the calf tourniquet inflated to 250 mmHg. Laterally, an 8-10 cm incision was made over the lateral aspect of the distal fibula. The incision was carried down through the subcutaneous tissues to expose the fracture site. The fracture hematoma was debrided before the fracture was reduced and temporarily secured using a bone clamp. Two lag screws were placed in an anterior to posterior  direction perpendicular to the fracture. An 8-hole Biomet composite locking plate was contoured using the appropriate plate benders before it was applied over the lateral aspect of the distal fibula. After verifying its position fluoroscopically, it was secured using a 3.5 mm nonlocking cortical screw proximal to the fracture. Again the plate's position was adjusted slightly based on AP and lateral projections before it was secured using twoadditional bicortical screws proximally and three locking screws distally. The adequacy of fracture reduction and hardware position was verified fluoroscopically in AP and lateral projections and found to be excellent.   The surgical wound was copiously irrigated with sterile saline solution. The subcutaneous tissues were closed in two layers using 2-0 Vicryl interrupted sutures before the skin was closed using staples. A total of 20 cc of 0.5% plain Sensorcaine was injected in and around the incision sites to help with postoperative analgesia. Sterile bulky dressings were applied to the wounds before the patient was placed into a posterior splint with a sugar tong supplement, maintaining the ankle in neutral dorsiflexion. The patient was then awakened and returned to the recovery room in satisfactory condition after tolerating the procedure well.

## 2019-03-27 NOTE — Transfer of Care (Signed)
Immediate Anesthesia Transfer of Care Note  Patient: Luke Gonzales  Procedure(s) Performed: OPEN REDUCTION INTERNAL FIXATION OF RIGHT DISTAL FIBULA  FRACTURE (Right Ankle)  Patient Location: PACU  Anesthesia Type:Spinal  Level of Consciousness: awake, alert , oriented and patient cooperative  Airway & Oxygen Therapy: Patient Spontanous Breathing  Post-op Assessment: Report given to RN and Post -op Vital signs reviewed and stable  Post vital signs: Reviewed and stable  Last Vitals:  Vitals Value Taken Time  BP 105/59 03/27/19 1441  Temp    Pulse 72 03/27/19 1442  Resp 23 03/27/19 1442  SpO2 97 % 03/27/19 1442  Vitals shown include unvalidated device data.  Last Pain:  Vitals:   03/27/19 1153  TempSrc: Tympanic  PainSc: 0-No pain         Complications: No apparent anesthesia complications

## 2019-03-28 DIAGNOSIS — S82831A Other fracture of upper and lower end of right fibula, initial encounter for closed fracture: Secondary | ICD-10-CM | POA: Diagnosis not present

## 2019-03-28 LAB — BASIC METABOLIC PANEL
Anion gap: 7 (ref 5–15)
BUN: 24 mg/dL — ABNORMAL HIGH (ref 8–23)
CO2: 25 mmol/L (ref 22–32)
Calcium: 8.2 mg/dL — ABNORMAL LOW (ref 8.9–10.3)
Chloride: 106 mmol/L (ref 98–111)
Creatinine, Ser: 1.14 mg/dL (ref 0.61–1.24)
GFR calc Af Amer: >60 mL/min (ref >60)
GFR calc non Af Amer: >60 mL/min (ref >60)
Glucose, Bld: 113 mg/dL — ABNORMAL HIGH (ref 70–99)
Potassium: 4 mmol/L (ref 3.5–5.1)
Sodium: 138 mmol/L (ref 135–145)

## 2019-03-28 MED ORDER — ASPIRIN EC 325 MG PO TBEC: 325.0000 mg | DELAYED_RELEASE_TABLET | Freq: Every day | ORAL | 0 refills | Status: AC

## 2019-03-28 NOTE — Anesthesia Postprocedure Evaluation (Signed)
Anesthesia Post Note  Patient: Luke Gonzales  Procedure(s) Performed: OPEN REDUCTION INTERNAL FIXATION OF RIGHT DISTAL FIBULA  FRACTURE (Right Ankle)  Patient location during evaluation: Nursing Unit Anesthesia Type: Spinal Level of consciousness: awake and alert Pain management: pain level controlled Vital Signs Assessment: post-procedure vital signs reviewed and stable Respiratory status: spontaneous breathing, nonlabored ventilation and respiratory function stable Cardiovascular status: blood pressure returned to baseline and stable Postop Assessment: no headache, no backache, no apparent nausea or vomiting, adequate PO intake and able to ambulate Anesthetic complications: no     Last Vitals:  Vitals:   03/28/19 0620 03/28/19 0727  BP: (!) 114/54 (!) 117/58  Pulse: (!) 55 (!) 58  Resp:  17  Temp: 36.9 C 36.8 C  SpO2: 96% 96%    Last Pain:  Vitals:   03/28/19 0626  TempSrc:   PainSc: 0-No pain                 Lia Foyer

## 2019-03-28 NOTE — Progress Notes (Signed)
Patient's discharge instructions stated to take tylenol scheduled and norco PRN. norco RX not on unit or given to patient (patient stated that he does not need). Notified Dr. Roland Rack and stated to change tylenol to PRN and not to send norco rx home with patient (MD in surgery). Patient educated on new discharge instructions and verbalized understanding. AD Tiffany, RN notified of discharge barrier and resolution.  Lupita Leash

## 2019-03-28 NOTE — Discharge Summary (Addendum)
Physician Discharge Summary  Patient ID: Luke Gonzales MRN: [id number] DOB/AGE: [birth year] 79 y.o.  Admit date: 03/27/2019 Discharge date: 03/28/2019  Admission Diagnoses:  Ankle fracture, right [S82.891A]  Discharge Diagnoses: Patient Active Problem List   Diagnosis Date Noted  . Ankle fracture, right 03/27/2019  . Aortic stenosis, severe 02/27/2015    Past Medical History:  Diagnosis Date  . Aortic stenosis s/p bicupspid valve  . Arthritis   . BPH (benign prostatic hypertrophy)   . Coronary artery disease   . Gout   . Hyperlipemia   . Hypertension   . Shortness of breath dyspnea      Transfusion: None.   Consultants (if any):   Discharged Condition: Improved  Hospital Course: ALOISE Gonzales is an 79 y.o. male who was admitted 03/27/2019 with a diagnosis of a unstable right distal fibular fracture and went to the operating room on 03/27/2019 and underwent the above named procedures.    Surgeries: Procedure(s): OPEN REDUCTION INTERNAL FIXATION OF RIGHT DISTAL FIBULA  FRACTURE on 03/27/2019 Patient tolerated the surgery well. Taken to PACU where she was stabilized and then transferred to the orthopedic floor.  Started on Lovenox 40mg  q 24 hrs. Foot pumps applied bilaterally at 80 mm. Heels elevated on bed with rolled towels. No evidence of DVT. Negative Homan. Physical therapy started on day #1 for gait training and transfer. OT started day #1 for ADL and assisted devices.  Patient's IV was removed on POD1.  Implants: Biomet ALPS 8-hole composite locking plate and screws  He was given perioperative antibiotics:  Anti-infectives (From admission, onward)   Start     Dose/Rate Route Frequency Ordered Stop   03/27/19 2200  oseltamivir (TAMIFLU) capsule 30 mg  Status:  Discontinued     30 mg Oral 2 times daily 03/27/19 1855 03/28/19 0437   03/27/19 1900  ceFAZolin (ANCEF) IVPB 2g/100 mL premix     2 g 200 mL/hr over 30 Minutes Intravenous Every 6 hours 03/27/19 1855  03/28/19 0647   03/27/19 1211  ceFAZolin (ANCEF) 2-4 GM/100ML-% IVPB    Note to Pharmacy: Milinda Cave   : cabinet override      03/27/19 1211 03/27/19 1310   03/27/19 1115  ceFAZolin (ANCEF) IVPB 2g/100 mL premix     2 g 200 mL/hr over 30 Minutes Intravenous On call to O.R. 03/27/19 1105 03/27/19 1249    .  He was given sequential compression devices, early ambulation, and Lovenox for DVT prophylaxis.  He benefited maximally from the hospital stay and there were no complications.    Recent vital signs:  Vitals:   03/28/19 0914 03/28/19 0919  BP:  (!) 131/48  Pulse: (!) 55 (!) 55  Resp:    Temp:    SpO2:      Recent laboratory studies:  Lab Results  Component Value Date   HGB 12.1 (L) 05/08/2015   Lab Results  Component Value Date   WBC 10.9 (H) 05/08/2015   PLT 372 05/08/2015   Lab Results  Component Value Date   INR 11.32 (HH) 05/08/2015   Lab Results  Component Value Date   NA 138 03/28/2019   K 4.0 03/28/2019   CL 106 03/28/2019   CO2 25 03/28/2019   BUN 24 (H) 03/28/2019   CREATININE 1.14 03/28/2019   GLUCOSE 113 (H) 03/28/2019    Discharge Medications:   Allergies as of 03/28/2019      Reactions   Warfarin And Related Other (See Comments)  Medication List    STOP taking these medications   aspirin 81 MG chewable tablet Replaced by: aspirin EC 325 MG tablet   oxyCODONE 5 MG immediate release tablet Commonly known as: Oxy IR/ROXICODONE   traMADol 50 MG tablet Commonly known as: ULTRAM   warfarin 5 MG tablet Commonly known as: COUMADIN     TAKE these medications   acetaminophen 500 MG tablet Commonly known as: TYLENOL Take 2 tablets (1,000 mg total) by mouth every 8 (eight) hours.   allopurinol 300 MG tablet Commonly known as: ZYLOPRIM Take 300 mg by mouth daily.   amiodarone 200 MG tablet Commonly known as: PACERONE Take 200 mg by mouth 2 (two) times daily. Reported on 06/07/2015   aspirin EC 325 MG tablet Take 1 tablet (325  mg total) by mouth daily. Replaces: aspirin 81 MG chewable tablet   atorvastatin 40 MG tablet Commonly known as: LIPITOR Take 40 mg by mouth every evening.   cetirizine 10 MG tablet Commonly known as: ZYRTEC Take 10 mg by mouth daily.   HYDROcodone-acetaminophen 5-325 MG tablet Commonly known as: NORCO/VICODIN Take 1-2 tablets by mouth every 6 (six) hours as needed for moderate pain or severe pain.   ibuprofen 600 MG tablet Commonly known as: ADVIL Take 1 tablet (600 mg total) by mouth every 6 (six) hours as needed.   metoprolol tartrate 50 MG tablet Commonly known as: LOPRESSOR Take 50 mg by mouth 2 (two) times daily.   niacin 1000 MG CR tablet Commonly known as: NIASPAN Take 1,000 mg by mouth at bedtime.   omeprazole 40 MG capsule Commonly known as: PRILOSEC TAKE 1 CAPSULE BY MOUTH DAILY   oseltamivir 75 MG capsule Commonly known as: TAMIFLU Take 1 capsule (75 mg total) by mouth 2 (two) times daily.            Durable Medical Equipment  (From admission, onward)         Start     Ordered   03/28/19 0955  For home use only DME Other see comment  Once    Comments: Rolling Walker Fracture scooter.  Question:  Length of Need  Answer:  12 Months   03/28/19 0955   03/28/19 0955  For home use only DME Walker rolling  Once    Question Answer Comment  Walker: With 5 Inch Wheels   Patient needs a walker to treat with the following condition Ankle fracture, right      03/28/19 0955          Diagnostic Studies: DG Tibia/Fibula Right  Result Date: 03/27/2019 CLINICAL DATA:  Right fibular fracture. EXAM: RIGHT TIBIA AND FIBULA - 2 VIEW; DG C-ARM 1-60 MIN COMPARISON:  03/24/2019 FINDINGS: Multiple C-arm images of the right ankle demonstrate lateral plate and screw fixation of distal fibular fracture. Reduction of ankle subluxation. IMPRESSION: ORIF distal fibular fracture. Electronically Signed   By: Franchot Gallo M.D.   On: 03/27/2019 14:33   DG Ankle Complete  Right  Result Date: 03/24/2019 CLINICAL DATA:  Fall yesterday with right ankle pain. EXAM: RIGHT ANKLE - COMPLETE 3+ VIEW COMPARISON:  None. FINDINGS: There is a displaced oblique fracture of the distal fibular metaphysis at and above the ankle mortise. Slight widening of the medial aspect of the ankle mortise. Small spurs over the posterior and inferior calcaneus. Mild degenerate change over the midfoot. IMPRESSION: Displaced oblique fracture of the distal fibular metaphysis. Slight widening of the medial aspect of the ankle mortise. Electronically Signed   By:  Marin Olp M.D.   On: 03/24/2019 10:36   DG Foot Complete Right  Result Date: 03/24/2019 CLINICAL DATA:  Fall.  Pain. EXAM: RIGHT FOOT COMPLETE - 3+ VIEW COMPARISON:  Right ankle series same day. FINDINGS: Diffuse soft tissue swelling. Displaced fracture of the distal fibula noted. Tiny bony density noted adjacent to the medial malleolus, this may represent a small fracture fragment. Old healed right third metatarsal fracture noted. Diffuse degenerative change, most prominent at the first MTP joint. Small metallic wire consistent foreign body noted over the soft tissues of the plantar aspect of the distal foot at the level of the distal second/third metatarsal. Old healed right third metatarsal fracture noted. IMPRESSION: 1. Displaced fracture of the distal fibula. Tiny bony density noted adjacent to the medial malleolus, this may represent a small fracture fragment. 2. Old healed third metatarsal fracture. No evidence of acute fracture involving the foot. Diffuse degenerative change, most prominent the first MTP joint. 3. Tiny metallic foreign body noted over the plantar aspect of the foot at the level of the distal second/third metatarsal. Electronically Signed   By: Marcello Moores  Register   On: 03/24/2019 10:36   DG C-Arm 1-60 Min  Result Date: 03/27/2019 CLINICAL DATA:  Right fibular fracture. EXAM: RIGHT TIBIA AND FIBULA - 2 VIEW; DG C-ARM 1-60  MIN COMPARISON:  03/24/2019 FINDINGS: Multiple C-arm images of the right ankle demonstrate lateral plate and screw fixation of distal fibular fracture. Reduction of ankle subluxation. IMPRESSION: ORIF distal fibular fracture. Electronically Signed   By: Franchot Gallo M.D.   On: 03/27/2019 14:33   Disposition: Discharge disposition: 01-Home or Self Care       Follow-up Information    Poggi, Marshall Cork, MD Follow up.   Specialty: Orthopedic Surgery Why: April 07, 2019 @ 2:45 pm  Contact information: [place] [address] [phone]          Signed: ADRYEL LAIBLE PA-C 03/28/2019, 10:28 AM

## 2019-03-28 NOTE — Progress Notes (Signed)
Physical Therapy Treatment Patient Details Name: Luke Gonzales MRN: [id number] DOB: 02/24/1940 Today's Date: 03/28/2019    History of Present Illness Pt is a 79 yo male diagnosed with an unstable distal fibular fracture of the right ankle s/p mechanical fall and is s/p ORIF.  PMH includes: HTN, CAD, SOB, and aortic valve replacement in 2017.    PT Comments    Pt was long sitting in bed upon arriving. He is A and O and motivated. " I'm ready to go home." Pt exited L side of bed without assistance. He stood and ambulated 2 x 60 ft with RW while maintaining NWB RLE. Pt demonstrates safe gait kinematics however does fatigue and require extended rest between gait trials. HR elevates to mid 90s with O2 > 92% throughout. Refused stair training however pt was educated on proper performance and reports he feels confident he can perform without any issues. Pt has supportive family and will have plenty of help at home. PT recommends d/c to home with HHPT to address deficits and assist pt returning to PLOF.   Follow Up Recommendations  Home health PT;Supervision for mobility/OOB     Equipment Recommendations  Rolling walker with 5" wheels    Recommendations for Other Services       Precautions / Restrictions Precautions Precautions: Fall Required Braces or Orthoses: Splint/Cast Restrictions Weight Bearing Restrictions: Yes RLE Weight Bearing: Non weight bearing    Mobility  Bed Mobility Overal bed mobility: Independent             General bed mobility comments: Pt demonstrates safe ability to exit bed without use od bedrails.  Transfers Overall transfer level: Modified independent Equipment used: Rolling walker (2 wheeled) Transfers: Sit to/from Stand Sit to Stand: Modified independent (Device/Increase time)         General transfer comment: Pt demonstrated safe ability to STS EOB and from recliner with maintaining proper NWB. No lifting assistance  required.  Ambulation/Gait Ambulation/Gait assistance: Supervision Gait Distance (Feet): 60 Feet Assistive device: Rolling walker (2 wheeled) Gait Pattern/deviations: WFL(Within Functional Limits) Gait velocity: decreased   General Gait Details: Pt demonstrated safe ability to ambulate with maintaining NWB. he performed 2 trials of 60 ft with RW without LOB or unsteadiness. He does require prolonged seated rest between trials however O2 > 92% and HR only elevated to mid 90s with exertion.   Stairs Stairs: (Pt unwilling even with encouragement. Therapist educated pt)           Engineer, building services [place] (Stroke Patients Only)       Balance Overall balance assessment: Modified Independent   Sitting balance-Leahy Scale: Good     Standing balance support: Bilateral upper extremity supported Standing balance-Leahy Scale: Good Standing balance comment: Pt demonstrates good static standing balance with fair dynamic. Pt reports he will have alot of help at home.                            Cognition Arousal/Alertness: Awake/alert Behavior During Therapy: WFL for tasks assessed/performed Overall Cognitive Status: Within Functional Limits for tasks assessed                                 General Comments: Pt is A and O x 4. Cooperative and pleasant. Agreeable to PT session.      Exercises      General Comments  Pertinent Vitals/Pain Pain Assessment: No/denies pain    Home Living                      Prior Function            PT Goals (current goals can now be found in the care plan section) Acute Rehab PT Goals Patient Stated Goal: " I just want to go home safely" Progress towards PT goals: Progressing toward goals    Frequency    BID      PT Plan Current plan remains appropriate    Co-evaluation              AM-PAC PT "6 Clicks" Mobility   Outcome Measure  Help needed turning from your  back to your side while in a flat bed without using bedrails?: None Help needed moving from lying on your back to sitting on the side of a flat bed without using bedrails?: None Help needed moving to and from a bed to a chair (including a wheelchair)?: None Help needed standing up from a chair using your arms (e.g., wheelchair or bedside chair)?: A Little Help needed to walk in hospital room?: A Little Help needed climbing 3-5 steps with a railing? : A Little 6 Click Score: 21    End of Session Equipment Utilized During Treatment: Gait belt Activity Tolerance: Patient tolerated treatment well Patient left: in chair;with call bell/phone within reach;with chair alarm set;with nursing/sitter in room Nurse Communication: Mobility status PT Visit Diagnosis: Unsteadiness on feet (R26.81);Difficulty in walking, not elsewhere classified (R26.2);Muscle weakness (generalized) (M62.81)     Time: [id number] PT Time Calculation (min) (ACUTE ONLY): 12 min  Charges:  $Gait Training: 8-22 mins                     Julaine Fusi PTA 03/28/19, 9:55 AM

## 2019-03-28 NOTE — Progress Notes (Signed)
  Subjective: 1 Day Post-Op Procedure(s) (LRB): OPEN REDUCTION INTERNAL FIXATION OF RIGHT DISTAL FIBULA  FRACTURE (Right) Patient reports pain as mild.   Patient is well, and has had no acute complaints or problems Plan is to go Home after hospital stay. Negative for chest pain and shortness of breath Fever: no Gastrointestinal:Negative for nausea and vomiting  Objective: Vital signs in last 24 hours: Temp:  [97 F (36.1 C)-98.9 F (37.2 C)] 98.4 F (36.9 C) (02/23 0620) Pulse Rate:  [53-82] 55 (02/23 0620) Resp:  [11-24] 16 (02/23 0312) BP: (105-135)/(53-102) 114/54 (02/23 0620) SpO2:  [95 %-100 %] 96 % (02/23 0620) Weight:  [83.9 kg] 83.9 kg (02/22 1935)  Intake/Output from previous day:  Intake/Output Summary (Last 24 hours) at 03/28/2019 0726 Last data filed at 03/28/2019 [id number] Gross per 24 hour  Intake 2515.26 ml  Output 1260 ml  Net 1255.26 ml    Intake/Output this shift: No intake/output data recorded.  Labs: No results for input(s): HGB in the last 72 hours. No results for input(s): WBC, RBC, HCT, PLT in the last 72 hours. Recent Labs    03/27/19 1938 03/28/19 0336  NA  --  138  K  --  4.0  CL  --  106  CO2  --  25  BUN  --  24*  CREATININE 1.15 1.14  GLUCOSE  --  113*  CALCIUM  --  8.2*   No results for input(s): LABPT, INR in the last 72 hours.   EXAM General - Patient is Alert, Appropriate and Oriented Extremity - ABD soft Incision: dressing C/D/I  Intact to light touch over the dorsal and volar aspect of toes. Cap refill intact. Able to flex and extend toes on command. Motor Function - intact, moving toes well on exam.  Past Medical History:  Diagnosis Date  . Aortic stenosis s/p bicupspid valve  . Arthritis   . BPH (benign prostatic hypertrophy)   . Coronary artery disease   . Gout   . Hyperlipemia   . Hypertension   . Shortness of breath dyspnea     Assessment/Plan: 1 Day Post-Op Procedure(s) (LRB): OPEN REDUCTION INTERNAL  FIXATION OF RIGHT DISTAL FIBULA  FRACTURE (Right) Active Problems:   Ankle fracture, right  Estimated body mass index is 25.09 kg/m as calculated from the following:   Height as of this encounter: 6' (1.829 m).   Weight as of this encounter: 83.9 kg. Advance diet Up with therapy   Up with therapy this AM. Patient requesting a fracture scooter to use at home. Will place order for DME. Plan for discharge home this afternoon pending progress with PT.  DVT Prophylaxis - Lovenox Non-weightbearing to the right leg.  Raquel Kurt, PA-C [facility] 03/28/2019, 7:26 AM

## 2019-03-28 NOTE — TOC Transition Note (Addendum)
Transition of Care [facility]) - CM/SW Discharge Note   Patient Details  Name: Luke Gonzales MRN: [id number] Date of Birth: [birth year]  [facility] CM/SW Contact:  Elease Hashimoto, LCSW Phone Number: 03/28/2019, 8:16 AM   Clinical Narrative:   Met with pt to discuss discharge needs. He needs a rw and HHPT. He is agreeable to both. Wants to wait on 3 in1 doesn't feel it is necessary, has a elevated commode at home. He lives with his wife who is in good health and can assist. They have two children local and several grandchildren to assist. He was independent prior to admission and drove. He feels he is ready and wants to go home. He has no pref regarding agencies. Have placed orders for [facility] and DME. RN aware to await rw delivery before DC pt. Sign off due to discharge met.  11:00 RW delivered to room bedside RN aware will call wife and let know will be ready for DC soon  Final next level of care: Home w Home Health Services Barriers to Discharge: Barriers Resolved   Patient Goals and CMS Choice Patient states their goals for this hospitalization and ongoing recovery are:: I hope to go home today      Discharge Placement                       Discharge Plan and Services In-house Referral: Clinical Social Work   Post Acute Care Choice: Home Health, Durable Medical Equipment          DME Arranged: Walker rolling DME Agency: [employer] Date DME Agency Contacted: 03/28/19 Time DME Agency Contacted: [phone] Representative spoke with at DME Agency: [place]oy [place]: PT [place]: [place] ([place]) Date [place]: 03/28/19 Time [place]: [phone] Representative spoke with at [place]: [place] ([place]) Interventions     Readmission Risk Interventions No flowsheet data found.

## 2020-05-03 DEATH — deceased

## 2021-08-14 IMAGING — XA DG TIBIA/FIBULA 2V*R*
7 series · 7 of 7 positions shown · non-contrast
Comparison: 03/24/2019

CLINICAL DATA: Right fibular fracture.

EXAM:
RIGHT TIBIA AND FIBULA - 2 VIEW; DG C-ARM 1-60 MIN

[Series 1: cont. · 1 of 1 slices shown (1 of 7)]
[im 1/1]
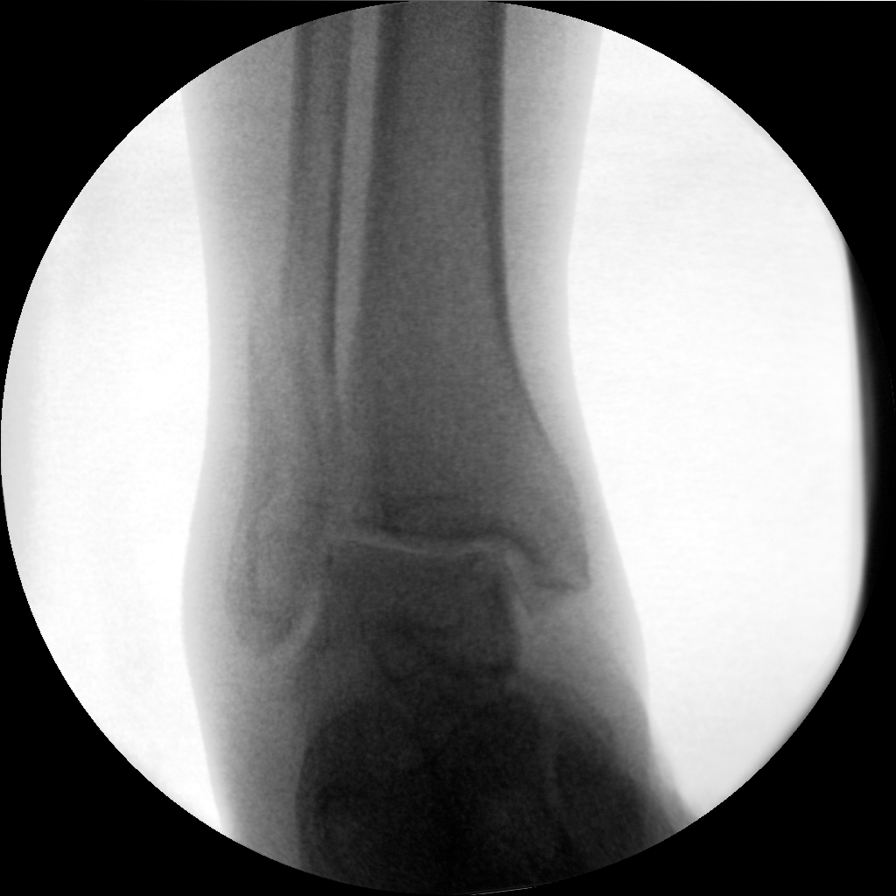

[Series 2: cont. · 1 of 1 slices shown (2 of 7)]
[im 1/1]
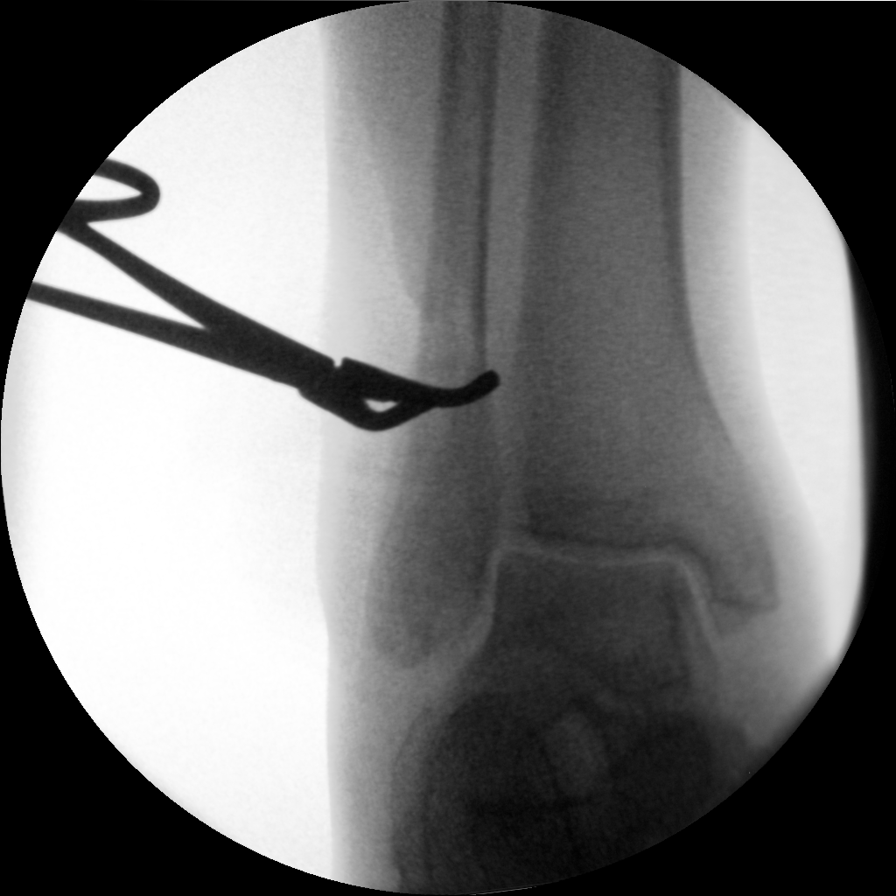

[Series 3: cont. · 1 of 1 slices shown (3 of 7)]
[im 1/1]
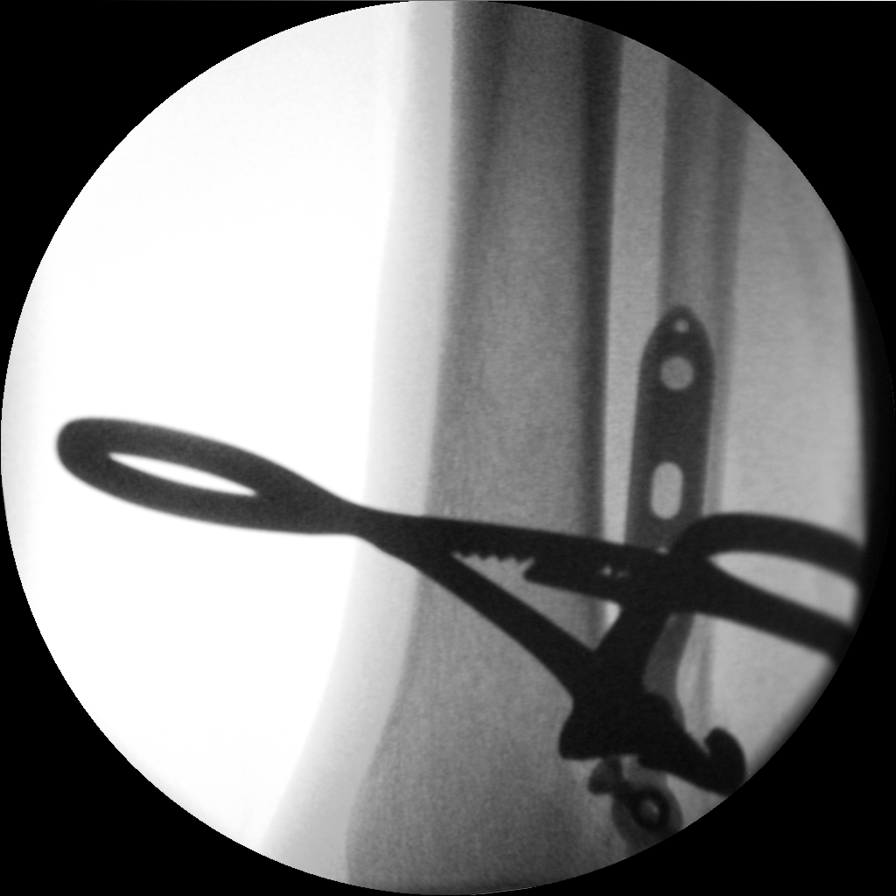

[Series 4: cont. · 1 of 1 slices shown (4 of 7)]
[im 1/1]
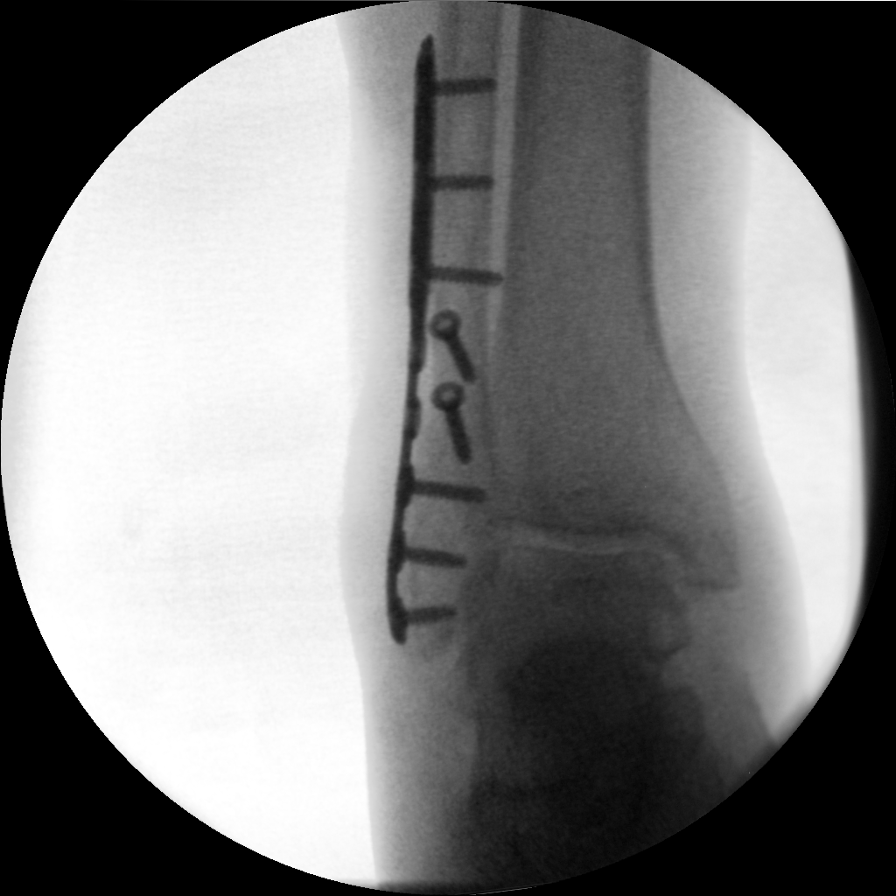

[Series 5: cont. · 1 of 1 slices shown (5 of 7)]
[im 1/1]
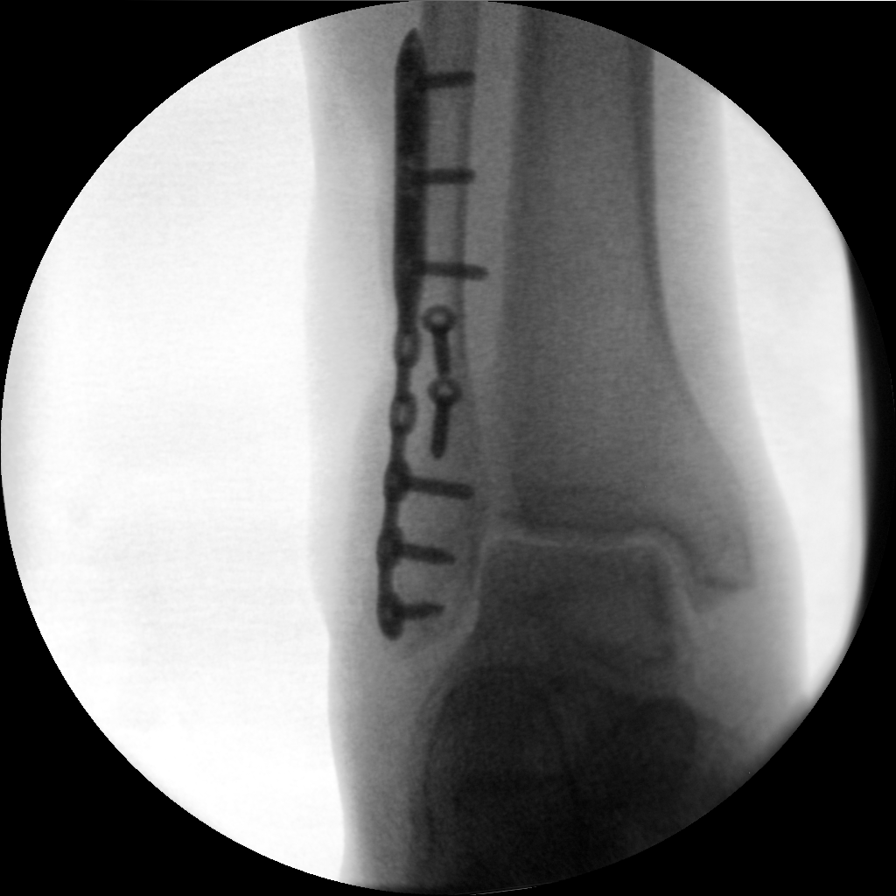

[Series 6: cont. · 1 of 1 slices shown (6 of 7)]
[im 1/1]
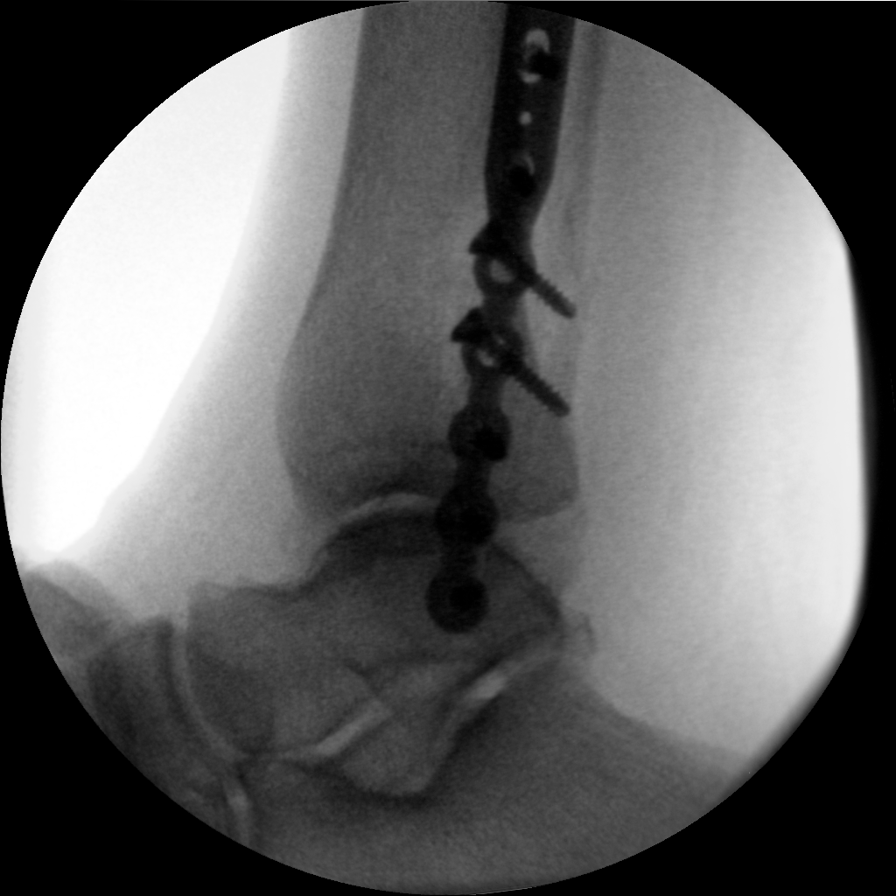

[Series 7: cont. · 1 of 1 slices shown (7 of 7)]
[im 1/1]
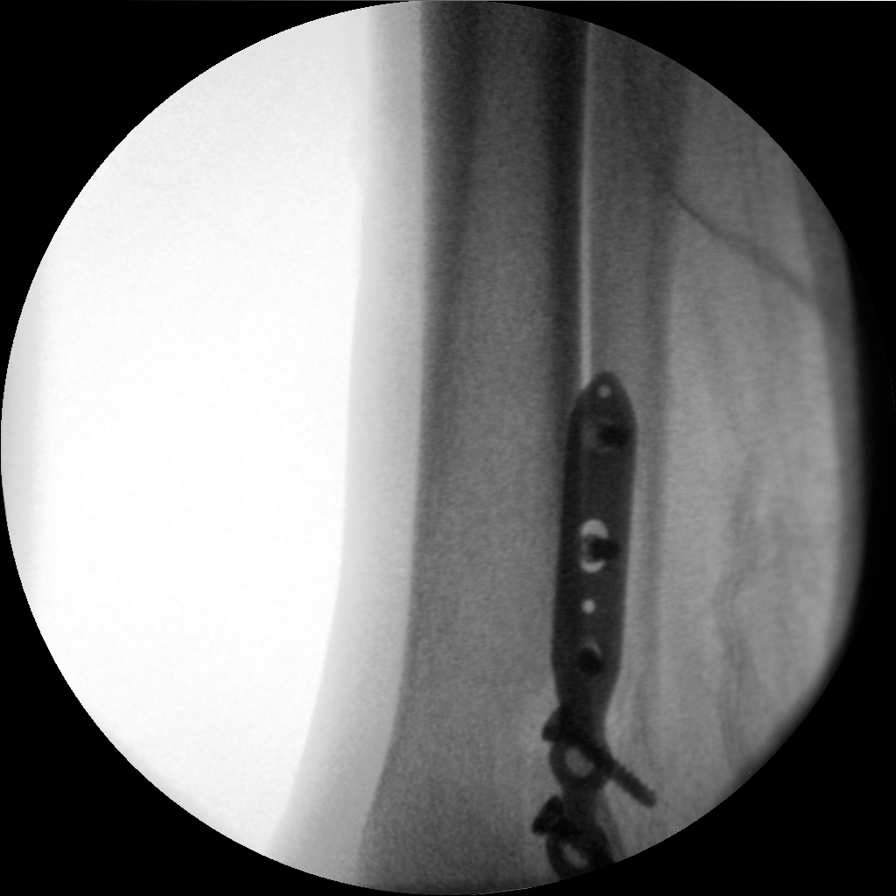

[7 of 7 positions shown; findings below may reference images not displayed]

FINDINGS: Multiple C-arm images of the right ankle demonstrate lateral plate
and screw fixation of distal fibular fracture. Reduction of ankle
subluxation.
IMPRESSION: ORIF distal fibular fracture.

## 2021-08-14 IMAGING — XA DG C-ARM 1-60 MIN
7 series · 7 of 7 positions shown · non-contrast
Comparison: 03/24/2019

CLINICAL DATA: Right fibular fracture.

EXAM:
RIGHT TIBIA AND FIBULA - 2 VIEW; DG C-ARM 1-60 MIN

[Series 1: cont. · 1 of 1 slices shown (1 of 7)]
[im 1/1]
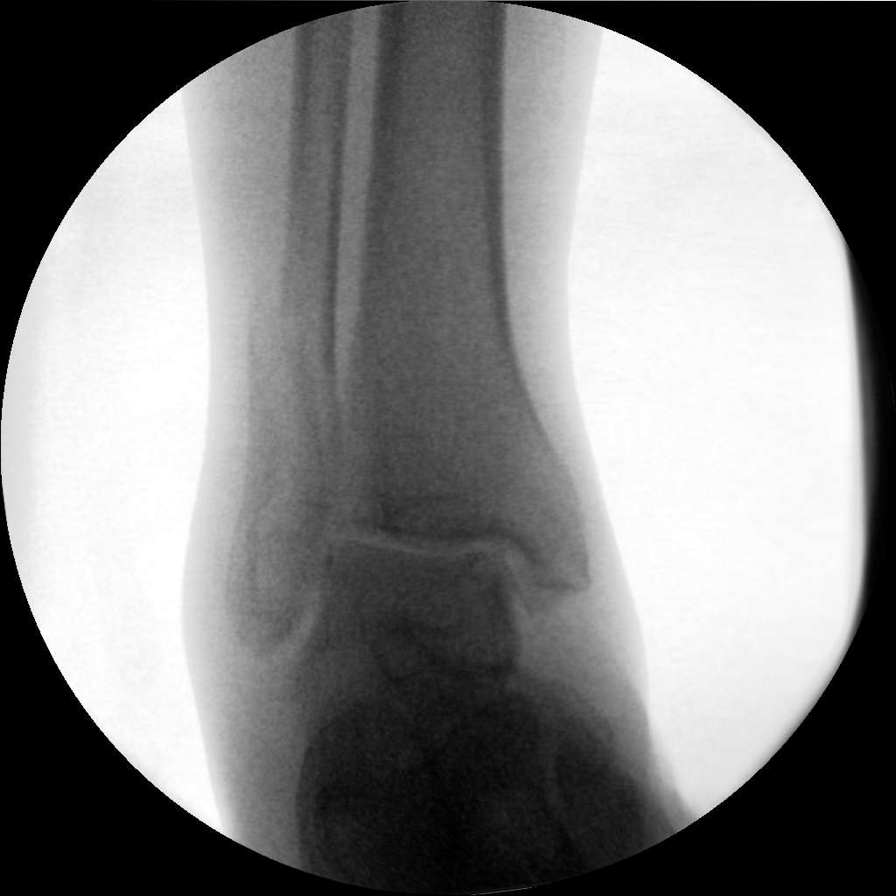

[Series 2: cont. · 1 of 1 slices shown (2 of 7)]
[im 1/1]
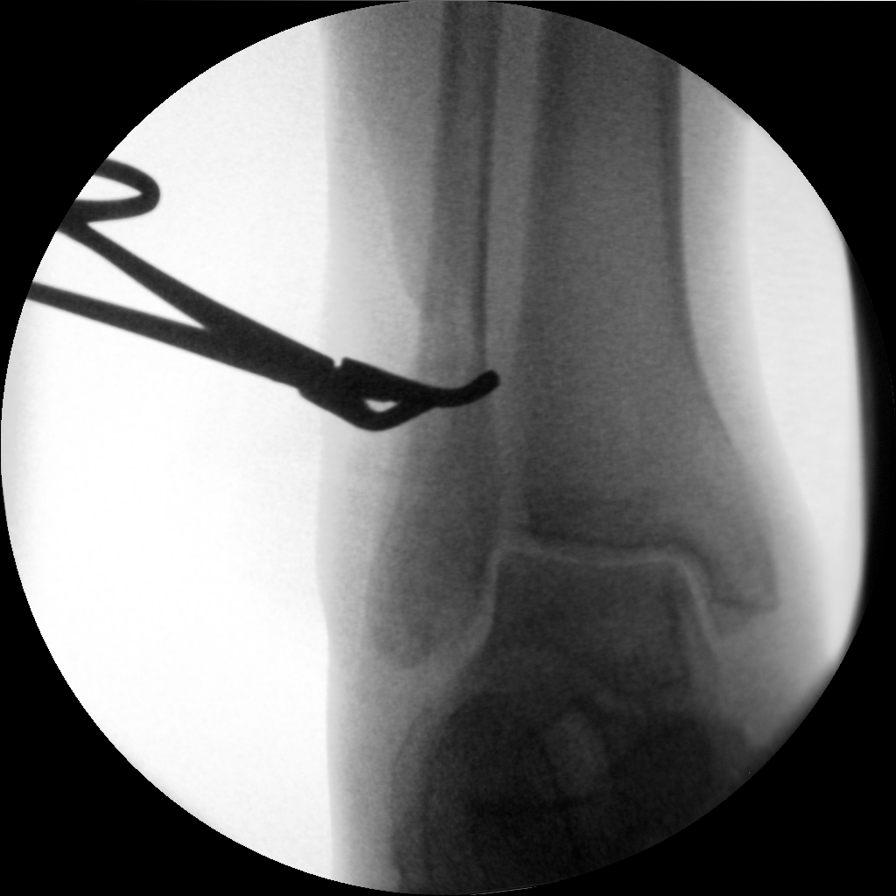

[Series 3: cont. · 1 of 1 slices shown (3 of 7)]
[im 1/1]
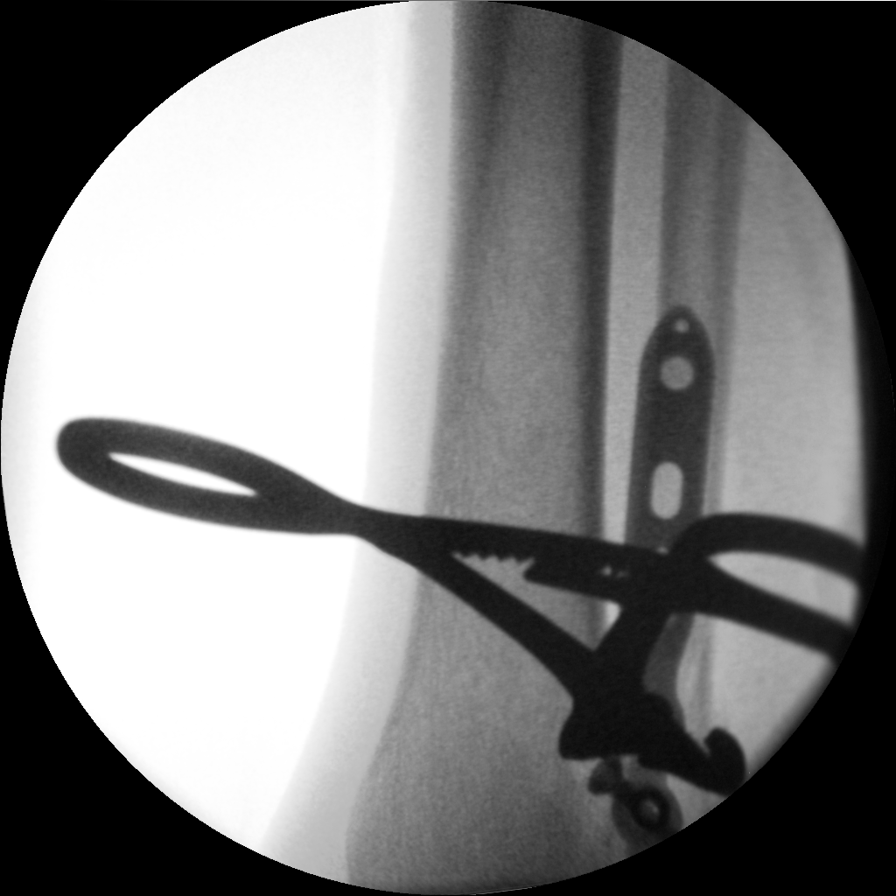

[Series 4: cont. · 1 of 1 slices shown (4 of 7)]
[im 1/1]
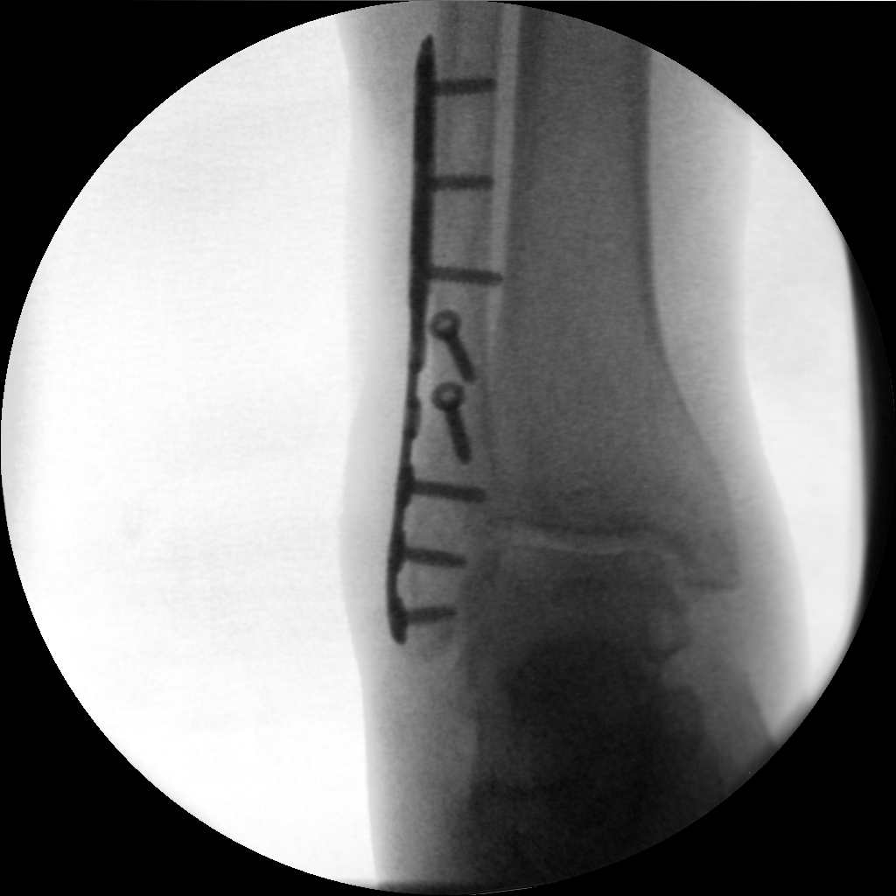

[Series 5: cont. · 1 of 1 slices shown (5 of 7)]
[im 1/1]
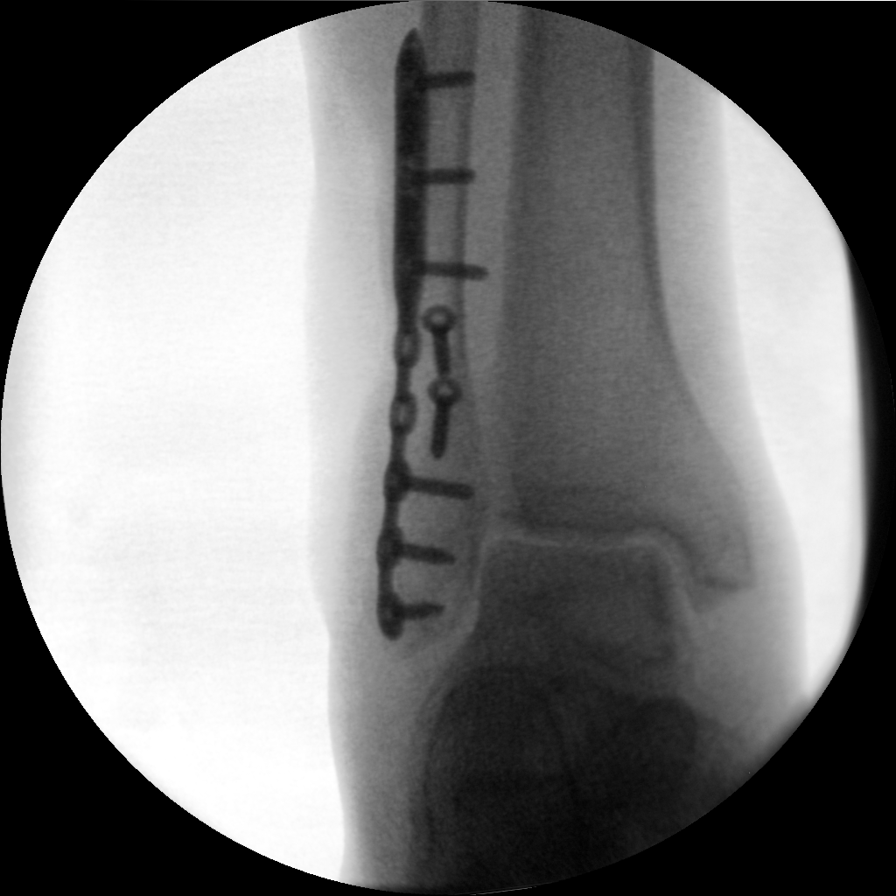

[Series 6: cont. · 1 of 1 slices shown (6 of 7)]
[im 1/1]
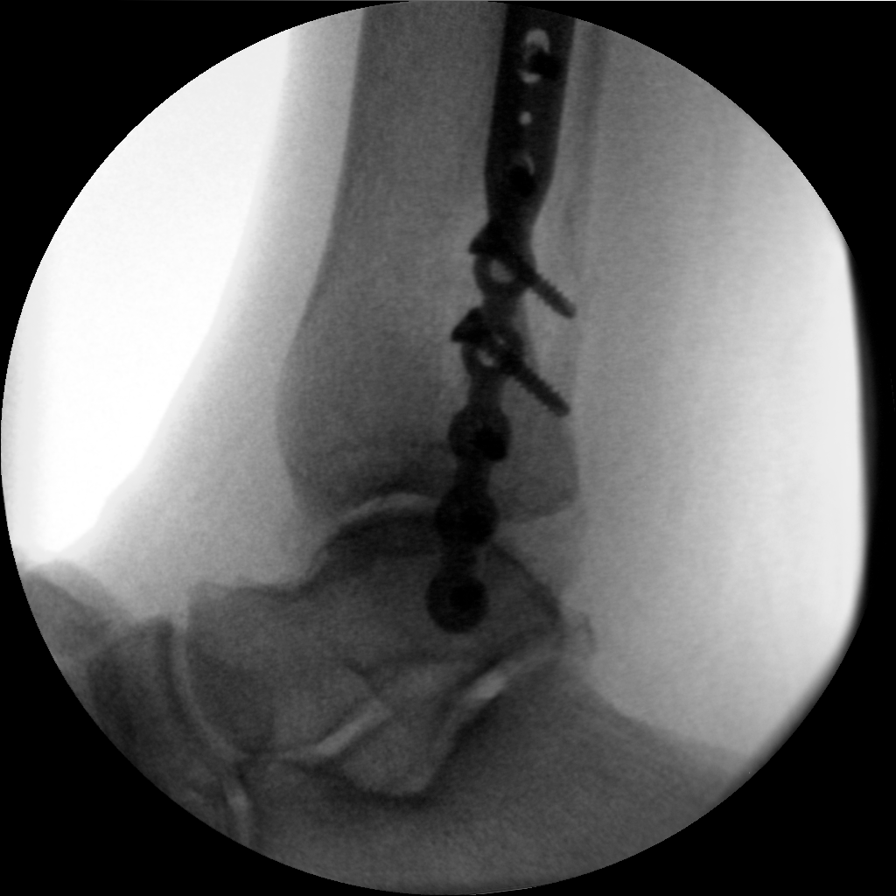

[Series 7: cont. · 1 of 1 slices shown (7 of 7)]
[im 1/1]
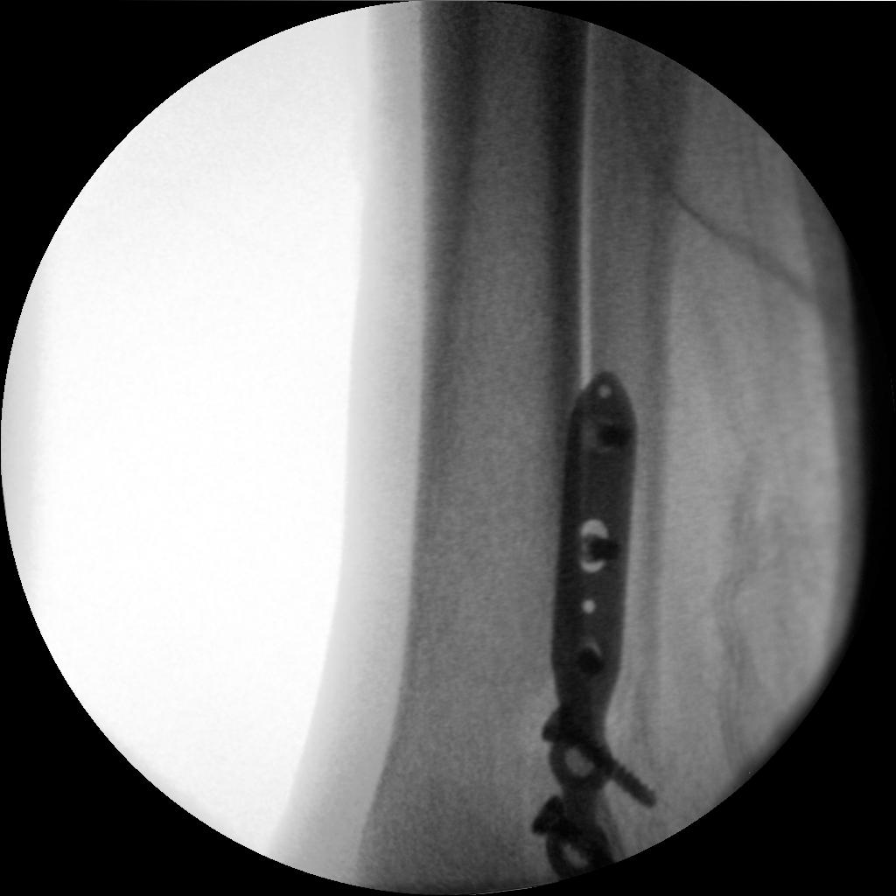

[7 of 7 positions shown; findings below may reference images not displayed]

FINDINGS: Multiple C-arm images of the right ankle demonstrate lateral plate
and screw fixation of distal fibular fracture. Reduction of ankle
subluxation.
IMPRESSION: ORIF distal fibular fracture.

## 2021-09-11 ENCOUNTER — Encounter: Payer: Self-pay | Admitting: Emergency Medicine

## 2021-09-11 ENCOUNTER — Ambulatory Visit
Admission: EM | Admit: 2021-09-11 | Discharge: 2021-09-11 | Disposition: A | Discharge status: Home / Self Care | Payer: Medicare Other

## 2021-09-11 DIAGNOSIS — K1379 Other lesions of oral mucosa: Secondary | ICD-10-CM

## 2021-09-11 DIAGNOSIS — Z9289 Personal history of other medical treatment: Secondary | ICD-10-CM

## 2021-09-11 NOTE — ED Triage Notes (Signed)
Pt has 2 tooth pulled last week on the top right. The area where the tooth was pulled started bleeding last night and this morning. Pt denies any pain.

## 2021-09-11 NOTE — Discharge Instructions (Addendum)
-  One of your stitches came apart.  That is why you have had the bleeding.  There is a small clot which has formed in the area so hopefully you do not have any rebleeding.  If you do, hold a piece of gauze over the area and apply pressure.  Bleeding should stop within a few minutes.  If you have any excessive bleeding that is difficult to stop, please go to the emergency department. - Contact your dentist on Monday since the office is closed now.  Let them know that your stitch came apart. - If you have any associated pain, fever, swelling, please return or go to the emergency department.

## 2021-09-11 NOTE — ED Provider Notes (Signed)
[facility]    CSN: [id number] Arrival date & time: 09/11/21  0816      History   Chief Complaint Chief Complaint  Patient presents with   Bleeding from gums    HPI Luke Gonzales is a 81 y.o. male presenting for dental concerns. He says that he had 2 wisdom teeth extracted last week (5 days ago). Last night and today, he noticed bleeding from the area where the tooth was pulled. He denies associated dental pain, facial swelling, mouth swelling/lesions, sore throat, fever.  He says he contacted his dentist office, but his dentist is on vacation until Monday.  He reports that he does have stitches in his mouth which are supposed to dissolve.  He does not know if the stitches got ripped somehow.  He reports that the bleeding episodes that he had took in about 30 minutes to stop.  Reports that he tried different things including holding pressure on the area and also using teabags.  Patient denies any bleeding at this time.  He denies any recent trauma to the mouth or injury.  He denies eating or brushing his teeth when the bleeding occurred.  States that when it happened this morning it was about 4 AM when he was sleeping.  Patient is not taking any anticoagulants.  HPI  Past Medical History:  Diagnosis Date   Aortic stenosis s/p bicupspid valve   Arthritis    BPH (benign prostatic hypertrophy)    Coronary artery disease    Gout    Hyperlipemia    Hypertension    Shortness of breath dyspnea     Patient Active Problem List   Diagnosis Date Noted   Ankle fracture, right 03/27/2019   Aortic stenosis, severe 02/27/2015    Past Surgical History:  Procedure Laterality Date   CARDIAC CATHETERIZATION N/A 02/27/2015   Procedure: Right/Left Heart Cath and Coronary Angiography;  Surgeon: Corey Skains, MD;  Location: [place] [facility];  Service: Cardiovascular;  Laterality: N/A;   ORIF ANKLE FRACTURE Right 03/27/2019   Procedure: OPEN REDUCTION INTERNAL FIXATION OF  RIGHT DISTAL FIBULA  FRACTURE;  Surgeon: Corky Mull, MD;  Location: [facility];  Service: Orthopedics;  Laterality: Right;       Home Medications    Prior to Admission medications   Medication Sig Start Date End Date Taking? Authorizing Provider  acetaminophen (TYLENOL) 500 MG tablet Take 2 tablets (1,000 mg total) by mouth every 8 (eight) hours. 03/24/19   Zigmund Gottron, NP  allopurinol (ZYLOPRIM) 300 MG tablet Take 300 mg by mouth daily.    [provider]  amiodarone (PACERONE) 200 MG tablet Take 200 mg by mouth 2 (two) times daily. Reported on 06/07/2015    [provider]  aspirin EC 325 MG tablet Take 1 tablet (325 mg total) by mouth daily. 03/28/19   Lattie Corns, PA-C  atorvastatin (LIPITOR) 40 MG tablet Take 40 mg by mouth every evening.     [provider]  cetirizine (ZYRTEC) 10 MG tablet Take 10 mg by mouth daily.    [provider]  ibuprofen (ADVIL) 600 MG tablet Take 1 tablet (600 mg total) by mouth every 6 (six) hours as needed. 03/24/19   Zigmund Gottron, NP  metoprolol (LOPRESSOR) 50 MG tablet Take 50 mg by mouth 2 (two) times daily.    [provider]  niacin (NIASPAN) 1000 MG CR tablet Take 1,000 mg by mouth at bedtime.    [provider]  omeprazole (PRILOSEC) 40 MG capsule TAKE 1 CAPSULE BY MOUTH DAILY 07/22/18   [provider]  oseltamivir (TAMIFLU) 75 MG capsule Take 1 capsule (75 mg total) by mouth 2 (two) times daily. Patient not taking: Reported on 03/28/2019 03/30/16   Norval Gable, MD    Family History History reviewed. No pertinent family history.  Social History Social History   Tobacco Use   Smoking status: Former    Years: 15.00    Types: Cigarettes   Smokeless tobacco: Never  Vaping Use   Vaping Use: Never used  Substance Use Topics   Alcohol use: No   Drug use: No     Allergies   Warfarin and related   Review of Systems Review of Systems  Constitutional:  Negative  for fatigue and fever.  HENT:  Positive for dental problem. Negative for facial swelling and mouth sores.   Neurological:  Negative for headaches.     Physical Exam Triage Vital Signs ED Triage Vitals  Enc Vitals Group     BP 09/11/21 0831 (!) 154/78     Pulse Rate 09/11/21 0831 (!) 59     Resp 09/11/21 0831 16     Temp 09/11/21 0831 98.3 F (36.8 C)     Temp Source 09/11/21 0831 Oral     SpO2 09/11/21 0831 98 %     Weight --      Height --      Head Circumference --      Peak Flow --      Pain Score 09/11/21 0830 0     Pain Loc --      Pain Edu? --      Excl. in [place]? --    No data found.  Updated Vital Signs BP (!) 154/78 (BP Location: Left Arm)   Pulse (!) 59   Temp 98.3 F (36.8 C) (Oral)   Resp 16   SpO2 98%      Physical Exam Vitals and nursing note reviewed.  Constitutional:      General: He is not in acute distress.    Appearance: Normal appearance. He is well-developed. He is not ill-appearing.  HENT:     Head: Normocephalic and atraumatic.     Mouth/Throat:     Mouth: Mucous membranes are moist.     Pharynx: Oropharynx is clear.     Comments: UPPER RIGHT: Wears the 2 wisdom teeth would be on the right upper side, they have been removed and there are stitches in the gingiva.  One of the stitches has come apart and there is a small clot in this area.  He does not have any tenderness when I palpate this area.  No active bleeding.  No erythema or swelling. Eyes:     General: No scleral icterus.    Conjunctiva/sclera: Conjunctivae normal.  Cardiovascular:     Rate and Rhythm: Regular rhythm. Bradycardia present.  Pulmonary:     Effort: Pulmonary effort is normal. No respiratory distress.     Breath sounds: Normal breath sounds.  Musculoskeletal:     Cervical back: Neck supple.  Skin:    General: Skin is warm and dry.     Capillary Refill: Capillary refill takes less than 2 seconds.  Neurological:     General: No focal deficit present.     Mental  Status: He is alert. Mental status is at baseline.     Motor: No weakness.     Gait: Gait normal.  Psychiatric:  Mood and Affect: Mood normal.        Behavior: Behavior normal.      UC Treatments / Results  Labs (all labs ordered are listed, but only abnormal results are displayed) Labs Reviewed - No data to display  EKG   Radiology No results found.  Procedures Procedures (including critical care time)  Medications Ordered in UC Medications - No data to display  Initial Impression / Assessment and Plan / UC Course  I have reviewed the triage vital signs and the nursing notes.  Pertinent labs & imaging results that were available during my care of the patient were reviewed by me and considered in my medical decision making (see chart for details).   81 y/o male presenting for bleeding from gingiva where a tooth was extracted (last week) this morning and last night. Denies fever or pain. No facial swelling.  Patient is afebrile and overall well appearing.  On exam he does have an area where to upper wisdom tooth have been removed and sutures are in place.  When the stitches does come apart and there is a small blood clot in the area.  No active bleeding at this time.  No tenderness.  No evidence of infection.  Advised patient to follow-up with dentist on Monday, otherwise advised him to consume soft foods and avoid brushing the area.  Advised that if he does have bleeding to hold a piece of gauze on the area until the bleeding stops.  Advised to go to emergency department for any severe or difficult to control bleeding.  Advised him to return here or go to ER for any signs of infection including fever, increased swelling or pain.   Final Clinical Impressions(s) / UC Diagnoses   Final diagnoses:  Bleeding from mouth  History of dental surgery     Discharge Instructions      -One of your stitches came apart.  That is why you have had the bleeding.  There is a  small clot which has formed in the area so hopefully you do not have any rebleeding.  If you do, hold a piece of gauze over the area and apply pressure.  Bleeding should stop within a few minutes.  If you have any excessive bleeding that is difficult to stop, please go to the emergency department. - Contact your dentist on Monday since the office is closed now.  Let them know that your stitch came apart. - If you have any associated pain, fever, swelling, please return or go to the emergency department.     ED Prescriptions   None    PDMP not reviewed this encounter.   Danton Clap, PA-C 09/11/21 [phone]

## 2023-12-20 ENCOUNTER — Ambulatory Visit (INDEPENDENT_AMBULATORY_CARE_PROVIDER_SITE_OTHER)

## 2023-12-20 ENCOUNTER — Ambulatory Visit: Payer: Self-pay | Admitting: Emergency Medicine

## 2023-12-20 ENCOUNTER — Ambulatory Visit
Admission: EM | Admit: 2023-12-20 | Discharge: 2023-12-20 | Disposition: A | Discharge status: Home / Self Care | Attending: Emergency Medicine | Admitting: Emergency Medicine

## 2023-12-20 ENCOUNTER — Encounter: Payer: Self-pay | Admitting: Emergency Medicine

## 2023-12-20 DIAGNOSIS — R051 Acute cough: Secondary | ICD-10-CM

## 2023-12-20 DIAGNOSIS — J22 Unspecified acute lower respiratory infection: Secondary | ICD-10-CM

## 2023-12-20 LAB — POC COVID19/FLU A&B COMBO
Covid Antigen, POC: NEGATIVE
Influenza A Antigen, POC: NEGATIVE
Influenza B Antigen, POC: NEGATIVE

## 2023-12-20 MED ORDER — AEROCHAMBER MV MISC: 1 refills | Status: AC

## 2023-12-20 MED ORDER — BENZONATATE 200 MG PO CAPS: 200.0000 mg | ORAL_CAPSULE | Freq: Three times a day (TID) | ORAL | 0 refills | Status: AC | PRN

## 2023-12-20 MED ORDER — AMOXICILLIN-POT CLAVULANATE 875-125 MG PO TABS: 1.0000 | ORAL_TABLET | Freq: Two times a day (BID) | ORAL | 0 refills | Status: AC

## 2023-12-20 MED ORDER — ALBUTEROL SULFATE HFA 108 (90 BASE) MCG/ACT IN AERS
1.0000 | INHALATION_SPRAY | RESPIRATORY_TRACT | 0 refills | Status: AC | PRN
Start: 2023-12-20 — End: ?

## 2023-12-20 NOTE — Discharge Instructions (Signed)
 COVID, flu negative.  I am concerned that you have a bronchitis or early pneumonia.  We will contact you if the radiology overread differs enough from mine and we need to change things.  Finish the Augmentin, even if you feel better.  2 puffs from your albuterol inhaler using your spacer every 4-6 hours.  Tessalon for cough, plain Mucinex to help break up the chest congestion.  Go to the ER if you get worse or start having shortness of breath

## 2023-12-20 NOTE — ED Triage Notes (Signed)
 Pt presents with a productive cough and congestions x 2 days. Pt has taken OTC cold medication for his symptoms.

## 2023-12-20 NOTE — ED Provider Notes (Signed)
 HPI  SUBJECTIVE:  Luke Gonzales is a 83 y.o. male who presents with 2 to 3 days of cough productive of orangish sputum.  States he occasionally has orange sputum due to the glue that he uses for his false teeth.  He has unable to sleep at night because of the cough.  No hemoptysis fevers, body aches, headaches, nasal congestion, rhinorrhea, sinus pain or pressure, sore throat, postnasal drip, wheezing, chest pain, change in his baseline shortness of breath, dyspnea on exertion.  No antibiotics in the past 3 months.  No antipyretic in the past 6 hours.  He tried Sudafed without improvement in his symptoms.  Symptoms worse at night.  He has a past medical history of hypertension, coronary disease, hypercholesterolemia aortic stenosis status post valve replacement.  No history of pulmonary disease, smoking, diabetes, hypertension, chronic kidney disease, CHF.  PCP: [facility].    Past Medical History:  Diagnosis Date   Aortic stenosis s/p bicupspid valve   Arthritis    BPH (benign prostatic hypertrophy)    Coronary artery disease    Gout    Hyperlipemia    Hypertension    Shortness of breath dyspnea     Past Surgical History:  Procedure Laterality Date   CARDIAC CATHETERIZATION N/A 02/27/2015   Procedure: Right/Left Heart Cath and Coronary Angiography;  Surgeon: Wolm JINNY Rhyme, MD;  Location: [facility];  Service: Cardiovascular;  Laterality: N/A;   ORIF ANKLE FRACTURE Right 03/27/2019   Procedure: OPEN REDUCTION INTERNAL FIXATION OF RIGHT DISTAL FIBULA  FRACTURE;  Surgeon: Edie Norleen JINNY, MD;  Location: [facility];  Service: Orthopedics;  Laterality: Right;    History reviewed. No pertinent family history.  Social History   Tobacco Use   Smoking status: Former    Types: Cigarettes   Smokeless tobacco: Never  Vaping Use   Vaping status: Never Used  Substance Use Topics   Alcohol use: No   Drug use: No    No current facility-administered medications for this  encounter.  Current Outpatient Medications:    albuterol (VENTOLIN HFA) 108 (90 Base) MCG/ACT inhaler, Inhale 1-2 puffs into the lungs every 4 (four) hours as needed for wheezing or shortness of breath., Disp: 1 each, Rfl: 0   amoxicillin-clavulanate (AUGMENTIN) 875-125 MG tablet, Take 1 tablet by mouth every 12 (twelve) hours., Disp: 14 tablet, Rfl: 0   benzonatate (TESSALON) 200 MG capsule, Take 1 capsule (200 mg total) by mouth 3 (three) times daily as needed for cough., Disp: 30 capsule, Rfl: 0   losartan (COZAAR) 50 MG tablet, Take 50 mg by mouth daily., Disp: , Rfl:    Spacer/Aero-Holding Chambers (AEROCHAMBER MV) inhaler, Use as instructed, Disp: 1 each, Rfl: 1   acetaminophen  (TYLENOL ) 500 MG tablet, Take 2 tablets (1,000 mg total) by mouth every 8 (eight) hours., Disp: 30 tablet, Rfl: 0   allopurinol  (ZYLOPRIM ) 300 MG tablet, Take 300 mg by mouth daily., Disp: , Rfl:    amiodarone (PACERONE) 200 MG tablet, Take 200 mg by mouth 2 (two) times daily. Reported on 06/07/2015, Disp: , Rfl:    aspirin  EC 325 MG tablet, Take 1 tablet (325 mg total) by mouth daily., Disp: 30 tablet, Rfl: 0   atorvastatin  (LIPITOR) 40 MG tablet, Take 40 mg by mouth every evening. , Disp: , Rfl:    [Paused] cetirizine (ZYRTEC) 10 MG tablet, Take 10 mg by mouth daily., Disp: , Rfl:    metoprolol  (LOPRESSOR ) 50 MG tablet, Take 50 mg by  mouth 2 (two) times daily., Disp: , Rfl:    niacin  (NIASPAN ) 1000 MG CR tablet, Take 1,000 mg by mouth at bedtime., Disp: , Rfl:    omeprazole (PRILOSEC) 40 MG capsule, TAKE 1 CAPSULE BY MOUTH DAILY, Disp: , Rfl:   Allergies  Allergen Reactions   Warfarin And Related Other (See Comments)     ROS  As noted in HPI.   Physical Exam  BP (!) 143/55 (BP Location: Left Arm)   Pulse 63   Temp 98.2 F (36.8 C) (Oral)   Resp 15   Wt 75.8 kg   SpO2 98%   BMI 22.65 kg/m   Constitutional: Well developed, well nourished, no acute distress Eyes: PERRL, EOMI, conjunctiva normal  bilaterally HENT: Normocephalic, atraumatic,mucus membranes moist Respiratory: Poor to fair air movement.  Wheezing left upper lobe.  No anterior, lateral chest wall tenderness Cardiovascular: Normal rate and rhythm, positive murmurs, no gallops, no rubs GI: nondistended skin: No rash, skin intact Musculoskeletal: No lower extremity edema Neurologic: Alert & oriented x 3, CN III-XII grossly intact, no motor deficits, sensation grossly intact Psychiatric: Speech and behavior appropriate   ED Course   Medications - No data to display  Orders Placed This Encounter  Procedures   DG Chest 2 View    Standing Status:   Standing    Number of Occurrences:   1    Reason for Exam (SYMPTOM  OR DIAGNOSIS REQUIRED):   Cough for 2 days, rule out pneumonia, other acute cardiopulmonary disease   POC Covid19/Flu A&B Antigen    Standing Status:   Standing    Number of Occurrences:   1   Results for orders placed or performed during the hospital encounter of 12/20/23 (from the past 24 hours)  POC Covid19/Flu A&B Antigen     Status: None   Collection Time: 12/20/23 11:32 AM  Result Value Ref Range   Influenza A Antigen, POC Negative Negative   Influenza B Antigen, POC Negative Negative   Covid Antigen, POC Negative Negative   DG Chest 2 View Result Date: 12/20/2023 CLINICAL DATA:  Cough. EXAM: CHEST - 2 VIEW COMPARISON:  03/30/2016. FINDINGS: Heart size is at the upper limits of normal. Prior aortic valve replacement. Redemonstrated fractured sternal wires with superior displacement of a 3.5 cm fragment of the right lateral aspect of the right sternal wire, new since prior exam. No focal consolidation, sizeable pleural effusion, or pneumothorax. Minimal bibasilar linear subsegmental atelectasis/scarring. Surgical clips in the right axillary region. Advanced degenerative changes of the right glenohumeral joint are again noted. IMPRESSION: 1. No acute cardiopulmonary findings. 2. Redemonstrated  fractured sternal wires with superior displacement of a 3.5 cm fragment of the right lateral aspect of the right sternal wire, new since prior exam. Electronically Signed   By: Harrietta Sherry M.D.   On: 12/20/2023 12:12    ED Clinical Impression  1. Lower respiratory tract infection   2. Acute cough      ED Assessment/Plan     COVID, Influenza negative. Reviewed imaging independently.  Blurred left heart border.  Increase markings seen on the lateral view.  No effusion.  No obvious consolidation.  Formal radiology overread pending.  Will contact patient at [phone] if radiology overread differs enough from mine and we need to change management.    Radiology report reviewed.  No acute cardiopulmonary disease.  Redemonstrated fractured sternal wires with superior displacement of the 3.5 cm fragment of the right sternal wire new since previous exam 7  years ago.  Will have staff call patient, notify him of findings and have him follow-up with his cardiothoracic surgeon.  See radiology report for full details.  Patient presents with a bronchitis, concern for early pneumonia.  Given the focal lung findings.  I am going to send him home with an albuterol inhaler with a spacer every 4-6 hours, Tessalon, Mucinex, push fluids and Augmentin 875 mg p.o. twice daily for 7 days.  Discussed labs, imaging, MDM, treatment plan, and plan for follow-up with patient Discussed sn/sx that should prompt return to the ED. patient agrees with plan.   Meds ordered this encounter  Medications   albuterol (VENTOLIN HFA) 108 (90 Base) MCG/ACT inhaler    Sig: Inhale 1-2 puffs into the lungs every 4 (four) hours as needed for wheezing or shortness of breath.    Dispense:  1 each    Refill:  0   benzonatate (TESSALON) 200 MG capsule    Sig: Take 1 capsule (200 mg total) by mouth 3 (three) times daily as needed for cough.    Dispense:  30 capsule    Refill:  0   Spacer/Aero-Holding Chambers (AEROCHAMBER MV)  inhaler    Sig: Use as instructed    Dispense:  1 each    Refill:  1   amoxicillin-clavulanate (AUGMENTIN) 875-125 MG tablet    Sig: Take 1 tablet by mouth every 12 (twelve) hours.    Dispense:  14 tablet    Refill:  0      *This clinic note was created using Scientist, clinical (histocompatibility and immunogenetics). Therefore, there may be occasional mistakes despite careful proofreading. ?    Van Knee, MD 12/21/23 [phone]
